# Patient Record
Sex: Female | Born: 1995 | Race: White | Hispanic: No | Marital: Single | Smoking: Never smoker
Health system: Southern US, Community
[De-identification: ages and names within clinical notes are randomized; demographics above are authoritative.]

## PROBLEM LIST (undated history)

## (undated) DIAGNOSIS — T50902A Poisoning by unspecified drugs, medicaments and biological substances, intentional self-harm, initial encounter: Secondary | ICD-10-CM

---
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
## 2013-06-13 ENCOUNTER — Ambulatory Visit (INDEPENDENT_AMBULATORY_CARE_PROVIDER_SITE_OTHER): Payer: BC Managed Care – PPO | Admitting: Physician Assistant

## 2013-06-13 VITALS — BP 124/78 | HR 76 | Temp 98.6°F | Resp 17 | Ht 63.0 in | Wt 109.0 lb

## 2013-06-13 DIAGNOSIS — R319 Hematuria, unspecified: Secondary | ICD-10-CM

## 2013-06-13 DIAGNOSIS — N390 Urinary tract infection, site not specified: Secondary | ICD-10-CM

## 2013-06-13 LAB — POCT URINE PREGNANCY: PREG TEST UR: NEGATIVE

## 2013-06-13 LAB — POCT URINALYSIS DIPSTICK
Bilirubin, UA: NEGATIVE
Glucose, UA: NEGATIVE
KETONES UA: NEGATIVE
Nitrite, UA: NEGATIVE
PH UA: 7
PROTEIN UA: NEGATIVE
Spec Grav, UA: 1.015
UROBILINOGEN UA: 0.2

## 2013-06-13 LAB — POCT UA - MICROSCOPIC ONLY
CASTS, UR, LPF, POC: NEGATIVE
Crystals, Ur, HPF, POC: NEGATIVE
MUCUS UA: NEGATIVE
YEAST UA: NEGATIVE

## 2013-06-13 MED ORDER — NITROFURANTOIN MONOHYD MACRO 100 MG PO CAPS: 100.0000 mg | ORAL_CAPSULE | Freq: Two times a day (BID) | ORAL | Status: DC

## 2013-06-13 NOTE — Progress Notes (Signed)
Subjective:    Patient ID: Westly Pamrang Sipp, female    DOB: 12/09/1995, 18 y.o.   MRN: [id number]  HPI   Ms. Laveda Normanran is a pleasant 18 yr old female here with concern for illness.  Reports 4-5 days of hematuria.  Just trace amounts - looks pink.  At first thought maybe her period started, but not heavy enough to be a period.  She also notes burning with urination.  +frequency, only urinates a small amount.  Some lower abd pain.  No NV, FC.  Actually feeling a little bit better today than she was previously.  Has never had this before.  LMP 12/1 or 12/2, periods irregular, maybe one every two months.  She is sexually active.  No contraception.  Occ condom use.  Last intercourse about 1 wk ago.  Denies concern for STI and declines testing today.     Review of Systems  Constitutional: Negative for fever and chills.  Respiratory: Negative.   Cardiovascular: Negative.   Gastrointestinal: Positive for abdominal pain (lower). Negative for nausea and vomiting.  Genitourinary: Positive for dysuria, frequency and hematuria. Negative for vaginal discharge.  Musculoskeletal: Negative.   Skin: Negative.        Objective:   Physical Exam  Vitals reviewed. Constitutional: She is oriented to person, place, and time. She appears well-developed and well-nourished. No distress.  HENT:  Head: Normocephalic and atraumatic.  Eyes: Conjunctivae are normal. No scleral icterus.  Cardiovascular: Normal rate, regular rhythm and normal heart sounds.   Pulmonary/Chest: Effort normal and breath sounds normal. She has no wheezes. She has no rales.  Abdominal: Soft. There is no tenderness. There is no CVA tenderness.  Neurological: She is alert and oriented to person, place, and time.  Skin: Skin is warm and dry.  Psychiatric: She has a normal mood and affect. Her behavior is normal.    Results for orders placed in visit on 06/13/13  POCT URINALYSIS DIPSTICK      Result Value Range   Color, UA yellow     Clarity, UA  clear     Glucose, UA neg     Bilirubin, UA neg     Ketones, UA neg     Spec Grav, UA 1.015     Blood, UA moderate     pH, UA 7.0     Protein, UA neg     Urobilinogen, UA 0.2     Nitrite, UA neg     Leukocytes, UA small (1+)    POCT UA - MICROSCOPIC ONLY      Result Value Range   WBC, Ur, HPF, POC 1-4     RBC, urine, microscopic 3-5     Bacteria, U Microscopic trace     Mucus, UA neg     Epithelial cells, urine per micros 3-7     Crystals, Ur, HPF, POC neg     Casts, Ur, LPF, POC neg     Yeast, UA neg    POCT URINE PREGNANCY      Result Value Range   Preg Test, Ur Negative           Assessment & Plan:  UTI (urinary tract infection) - Plan: POCT urine pregnancy, Urine culture, nitrofurantoin, macrocrystal-monohydrate, (MACROBID) 100 MG capsule  Blood in urine - Plan: POCT urinalysis dipstick, POCT UA - Microscopic Only   Ms. Laveda Normanran is a pleasant 18 yr old female here with UTI.  HCG is negative today.  Will start macrobid and send cx .  Push fluids.  RTC if worsening or not improving.  Will alter therapy if necessary based on cx results  Briefly discussed contraceptive options with pt as she is currently sexually active and using no form of birth control.  I have printed edu materials for her.  Would be happy to discuss further with her in the future.  Loleta Dicker MHS, PA-C [facility] 1/7/20152:13 PM

## 2013-06-13 NOTE — Patient Instructions (Signed)
Take the antibiotic (nitrofurantoin) as directed.  Be sure to finish the full course.  Drink plenty of water.  I will let you know when your culture results are back.  Please let us know if any symptoms are worsening or not improving  Please read the information about different birth control methods and let me know if you would like to start something   Urinary Tract Infection Urinary tract infections (UTIs) can develop anywhere along your urinary tract. Your urinary tract is your body's drainage system for removing wastes and extra water. Your urinary tract includes two kidneys, two ureters, a bladder, and a urethra. Your kidneys are a pair of bean-shaped organs. Each kidney is about the size of your fist. They are located below your ribs, one on each side of your spine. CAUSES Infections are caused by microbes, which are microscopic organisms, including fungi, viruses, and bacteria. These organisms are so small that they can only be seen through a microscope. Bacteria are the microbes that most commonly cause UTIs. SYMPTOMS  Symptoms of UTIs may vary by age and gender of the patient and by the location of the infection. Symptoms in young women typically include a frequent and intense urge to urinate and a painful, burning feeling in the bladder or urethra during urination. Older women and men are more likely to be tired, shaky, and weak and have muscle aches and abdominal pain. A fever may mean the infection is in your kidneys. Other symptoms of a kidney infection include pain in your back or sides below the ribs, nausea, and vomiting. DIAGNOSIS To diagnose a UTI, your caregiver will ask you about your symptoms. Your caregiver also will ask to provide a urine sample. The urine sample will be tested for bacteria and white blood cells. White blood cells are made by your body to help fight infection. TREATMENT  Typically, UTIs can be treated with medication. Because most UTIs are caused by a bacterial  infection, they usually can be treated with the use of antibiotics. The choice of antibiotic and length of treatment depend on your symptoms and the type of bacteria causing your infection. HOME CARE INSTRUCTIONS  If you were prescribed antibiotics, take them exactly as your caregiver instructs you. Finish the medication even if you feel better after you have only taken some of the medication.  Drink enough water and fluids to keep your urine clear or pale yellow.  Avoid caffeine, tea, and carbonated beverages. They tend to irritate your bladder.  Empty your bladder often. Avoid holding urine for long periods of time.  Empty your bladder before and after sexual intercourse.  After a bowel movement, women should cleanse from front to back. Use each tissue only once. SEEK MEDICAL CARE IF:   You have back pain.  You develop a fever.  Your symptoms do not begin to resolve within 3 days. SEEK IMMEDIATE MEDICAL CARE IF:   You have severe back pain or lower abdominal pain.  You develop chills.  You have nausea or vomiting.  You have continued burning or discomfort with urination. MAKE SURE YOU:   Understand these instructions.  Will watch your condition.  Will get help right away if you are not doing well or get worse. Document Released: 03/03/2005 Document Revised: 11/23/2011 Document Reviewed: 07/02/2011 [facility] Patient Information 2014 [place], [place].

## 2013-06-16 LAB — URINE CULTURE

## 2013-08-21 ENCOUNTER — Encounter (HOSPITAL_COMMUNITY): Payer: Self-pay | Admitting: Emergency Medicine

## 2013-08-21 ENCOUNTER — Emergency Department (HOSPITAL_COMMUNITY)
Admission: EM | Admit: 2013-08-21 | Discharge: 2013-08-22 | Disposition: A | Discharge status: Home / Self Care | Payer: BC Managed Care – PPO | Attending: Emergency Medicine | Admitting: Emergency Medicine

## 2013-08-21 DIAGNOSIS — Z3202 Encounter for pregnancy test, result negative: Secondary | ICD-10-CM | POA: Insufficient documentation

## 2013-08-21 DIAGNOSIS — D72829 Elevated white blood cell count, unspecified: Secondary | ICD-10-CM | POA: Insufficient documentation

## 2013-08-21 DIAGNOSIS — R55 Syncope and collapse: Secondary | ICD-10-CM | POA: Insufficient documentation

## 2013-08-21 DIAGNOSIS — R002 Palpitations: Secondary | ICD-10-CM

## 2013-08-21 DIAGNOSIS — R0602 Shortness of breath: Secondary | ICD-10-CM | POA: Insufficient documentation

## 2013-08-21 DIAGNOSIS — R110 Nausea: Secondary | ICD-10-CM | POA: Insufficient documentation

## 2013-08-21 LAB — CBC WITH DIFFERENTIAL/PLATELET
BASOS ABS: 0 10*3/uL (ref 0.0–0.1)
BASOS PCT: 0 % (ref 0–1)
Eosinophils Absolute: 0.5 10*3/uL (ref 0.0–0.7)
Eosinophils Relative: 4 % (ref 0–5)
HEMATOCRIT: 38 % (ref 36.0–46.0)
Hemoglobin: 12.7 g/dL (ref 12.0–15.0)
LYMPHS PCT: 21 % (ref 12–46)
Lymphs Abs: 2.5 10*3/uL (ref 0.7–4.0)
MCH: 30.5 pg (ref 26.0–34.0)
MCHC: 33.4 g/dL (ref 30.0–36.0)
MCV: 91.1 fL (ref 78.0–100.0)
MONO ABS: 0.8 10*3/uL (ref 0.1–1.0)
Monocytes Relative: 7 % (ref 3–12)
NEUTROS ABS: 7.8 10*3/uL — AB (ref 1.7–7.7)
Neutrophils Relative %: 67 % (ref 43–77)
PLATELETS: 269 10*3/uL (ref 150–400)
RBC: 4.17 MIL/uL (ref 3.87–5.11)
RDW: 13.2 % (ref 11.5–15.5)
WBC: 11.6 10*3/uL — AB (ref 4.0–10.5)

## 2013-08-21 LAB — BASIC METABOLIC PANEL
BUN: 11 mg/dL (ref 6–23)
CO2: 25 meq/L (ref 19–32)
CREATININE: 0.7 mg/dL (ref 0.50–1.10)
Calcium: 9.6 mg/dL (ref 8.4–10.5)
Chloride: 98 mEq/L (ref 96–112)
GFR calc non Af Amer: >90 mL/min (ref >90)
Glucose, Bld: 92 mg/dL (ref 70–99)
POTASSIUM: 4.2 meq/L (ref 3.7–5.3)
SODIUM: 137 meq/L (ref 137–147)

## 2013-08-21 LAB — URINALYSIS, ROUTINE W REFLEX MICROSCOPIC
BILIRUBIN URINE: NEGATIVE
Glucose, UA: NEGATIVE mg/dL
Hgb urine dipstick: NEGATIVE
Ketones, ur: NEGATIVE mg/dL
Nitrite: NEGATIVE
Protein, ur: NEGATIVE mg/dL
Specific Gravity, Urine: 1.017 (ref 1.005–1.030)
UROBILINOGEN UA: 1 mg/dL (ref 0.0–1.0)
pH: 7.5 (ref 5.0–8.0)

## 2013-08-21 LAB — CBG MONITORING, ED: Glucose-Capillary: 73 mg/dL (ref 70–99)

## 2013-08-21 LAB — URINE MICROSCOPIC-ADD ON

## 2013-08-21 LAB — I-STAT TROPONIN, ED: TROPONIN I, POC: 0 ng/mL (ref 0.00–0.08)

## 2013-08-21 LAB — POC URINE PREG, ED: PREG TEST UR: NEGATIVE

## 2013-08-21 NOTE — Discharge Instructions (Signed)
Recommend establishing a primary care provider. Recommend further evaluation of your intermittent heart palpitations, with heart monitor, if symptoms should persist. Resource guide provided below for further follow up and establishment of primary care doctor. Return to Emergency department if you develop any worsening symptoms.    Emergency Department Resource Guide 1) Find a Doctor and Pay Out of Pocket Although you won't have to find out who is covered by your insurance plan, it is a good idea to ask around and get recommendations. You will then need to call the office and see if the doctor you have chosen will accept you as a new patient and what types of options they offer for patients who are self-pay. Some doctors offer discounts or will set up payment plans for their patients who do not have insurance, but you will need to ask so you aren't surprised when you get to your appointment.  2) Contact Your Local Health Department Not all health departments have doctors that can see patients for sick visits, but many do, so it is worth a call to see if yours does. If you don't know where your local health department is, you can check in your phone book. The CDC also has a tool to help you locate your state's health department, and many state websites also have listings of all of their local health departments.  3) Find a Walk-in Clinic If your illness is not likely to be very severe or complicated, you may want to try a walk in clinic. These are popping up all over the country in pharmacies, drugstores, and shopping centers. They're usually staffed by nurse practitioners or physician assistants that have been trained to treat common illnesses and complaints. They're usually fairly quick and inexpensive. However, if you have serious medical issues or chronic medical problems, these are probably not your best option.  No Primary Care Doctor: - Call Health Connect at  [phone] - they can help you locate a  primary care doctor that  accepts your insurance, provides certain services, etc. - Physician Referral Service- [phone]  Chronic Pain Problems: Organization         Address  Phone   Notes  [facility]  [phone] Patients need to be referred by their primary care doctor.   Medication Assistance: Organization         Address  Phone   Notes  [facility] Medication [facility] [address] [place], [place] [address] [phone] --Must be a resident of [facility] -- Must have NO insurance coverage whatsoever (no Medicaid/ Medicare, etc.) -- The pt. MUST have a primary care doctor that directs their care regularly and follows them in the community   MedAssist  [phone]   [employer]  [phone]    Agencies that provide inexpensive medical care: Organization         Address  Phone   Notes  [facility]  [phone]   [facility]    [phone]   [facility] [address] [place], [place] [address] [phone]   [facility] [address] [place], [place] [phone]   Planned Parenthood    [phone]   [facility]    [phone]   [facility] and [facility]  [address], [place] Phone:  [phone], Fax:  [phone] Hours of Operation:  9 am - 6 pm,  M-F.  Also accepts Medicaid/Medicare and self-pay.  [facility]  [address], [place] Phone: [phone], Fax: [phone]. Hours of Operation:  8:30 am - 5:30 pm, M-F.  Also accepts Medicaid and self-pay.  [facility] [place] [address], [place] Phone: [phone]   [facility] [address], [place], [place] [phone], Ext. [phone] Mondays & Thursdays: 7-9 AM.  First 15 patients are seen on a first come, first serve basis.    Medicaid-accepting [facility]  Providers:  Organization         Address  Phone   Notes  [facility] [address], [place] [phone] Also accepts self-pay patients.  [facility] [address], [place]  [phone](336) [phone]   [facility] [address], [place] [phone]) [phone]   [facility] [address] [place] [address], [place] [phone]) [phone]   Renaye RakersVeita Bland [address], [place]   [phone] Only accepts [place] Access [place]icaid patients after they have their name applied to their card.   Self-Pay (no insurance) in [facility]:  Organization         Address  Phone   Notes  Sickle Cell Patients, [facility] [address], [place] [phone]) [phone]   [facility] [address], [place] [phone]   [facility]  [address], [place] [phone]) [phone]   [facility]/Dr. Osei-Bonsu  [address] [place] [address], [place] or [phone], [place] [phone] Phone number for both [place] and [place] locations is the same.  [facility] [address], [place] [phone]) [phone]   [facility] [address] [place] [address], [place] or [address] [phone]   [facility] [address], [place] [phone], phone; [phone]) [phone], fax Sees patients 1st and 3rd Saturday of every month.  Must not qualify for public or private insurance (i.e. Medicaid, Medicare, [place] Health Choice, Veterans' Benefits)  Household income should be no more than 200% of the poverty level The clinic cannot treat you if you are pregnant or think you are pregnant  Sexually transmitted diseases are not treated at the clinic.    Dental Care: Organization         Address  Phone  Notes  [facility] [address], [place] [phone]) [phone] Accepts children up to age 20 who are enrolled in [place]icaid or [place] Health Choice; pregnant women with a Medicaid card; and children who have applied for Medicaid or [place] Health Choice, but were declined, whose parents can pay a reduced fee at time of service.  [facility] [place]  [address], [place] [phone]) [phone] Accepts children up to age 20 who are enrolled in [place]icaid or [place] Health Choice; pregnant women with a Medicaid card; and children who have applied for Medicaid or [place] Health Choice, but were declined, whose parents can pay a reduced fee at time of service.  [facility]  [address], [place] [phone]) [phone] Patients are seen by appointment only. Walk-ins are not accepted. [facility] will see patients 20 years of age and older. Monday -  Tuesday (8am-5pm) Most Wednesdays (8:30-5pm) $30 per visit, cash only  [facility]  [address], [facility] [place] [phone] Patients are seen by appointment only. Walk-ins are not accepted. [facility] will see patients 20 years of age and older. One Wednesday Evening (Monthly: Volunteer Based).  $30 per visit, cash only  [facility]  [phone] for adults; Children under age 24, call Graduate Pediatric Dentistry at [phone]. Children aged 414-14, please call [phone] to request a pediatric application.  Dental services are provided in all areas of dental care including fillings, crowns and bridges, complete and partial dentures, implants, gum treatment, root canals, and extractions. Preventive care is also provided. Treatment is provided to both adults and children. Patients are selected via a lottery and there is often a waiting list.   [facility] [address], [place]  [phone]) [phone] [email or web address]   [facility]  [address], [place], [place] [phone], Ext. [phone] Second and Fourth Thursday of each month, opens at 6:30 AM; Clinic ends at 9 AM.  Patients are seen on a first-come first-served basis, and a limited number are seen during each clinic.   [facility]  [address], [place], [place] [phone]) [phone]   Eligibility Requirements You must have lived in [place], [place], or [place] counties for at least the last three months.   You cannot be eligible for state or federal sponsored National Cityhealthcare insurance, including CIGNAVeterans Administration, [place]icaid, or [employer] EntertainmentMedicare.   You generally cannot be eligible for healthcare insurance through your employer.    How to apply: Eligibility screenings are held every Tuesday and Wednesday afternoon from 1:00 pm until 4:00 pm. You do not need an appointment for the interview!  [facility] [address], [place], [place] [phone]   [facility]  [phone]   [facility]  [phone]   [facility]  [phone]    Behavioral Health Resources in the Community: Intensive Outpatient Programs Organization         Address  Phone  Notes  [facility] [address], [place], [place] [phone]   [facility] [address], [place], [place] [phone]   [facility] [address], [place], [place]  [phone]   [facility] [address],  [place], [place] [phone] or [phone]   Substance Abuse Resources Organization         Address  Phone  Notes  [facility]  [phone]   [facility]  [phone]   [facility]  [phone]   [facility]  [phone]   Residential & Outpatient Substance Abuse Program  [phone]   Psychological Services Organization         Address  Phone  Notes  [facility]  [phone]    [facility]  [phone]   [facility] [address], [place] [phone] or [phone]    Mobile Crisis Teams Organization         Address  Phone  Notes  [facility], Mobile Crisis Care Unit  [phone]   [facility]  [address] [place], [place] [phone]   Doristine LocksSharon DeEsch [address] [place] [phone]    Self-Help/Support Groups Organization         Address  Phone  Notes  [facility] [place] - variety of support groups  336- [phone] Call for more information  Narcotics Anonymous (NA), [facility] [address], [place]  2 meetings at this location   Statisticianesidential Treatment Programs Organization         Address  Phone  Notes  [facility] [address],    [place]  [phone]   [facility]  [address], [place] [phone], [place], [place] [phone]   [facility] [address], [place] [phone] Admissions: 8am-3pm M-F  [facility] [address]    [place], [place] [phone]   [facility] [address], [place], [place] [phone]   [facility] [address]  [place], [place] [phone]   [facility] - Intensive Outpatient [address], [place], [place] [phone]   [facility] ([facility] [address]  [place], [place] [phone] or [phone]   [facility] ([facility]) [address] [place], [place] [phone] Accepts Medicaid  [facility] [address]  [place] [phone] Substance Abuse/Addiction Treatment   [facility] Resources Organization         Address  Phone  Notes  [facility]  [phone]   Angie FavaJulie Brannon, PhD [address], [address] [place], [place]   [phone]) [phone] or [phone](336) [phone]    [facility]    [address] [place], [place] [phone](336) [phone]   [facility] [address], [place], [place] [phone]) [phone] Insurance/Medicaid/sponsorship through [facility]  [facility]ith [facility] Families [address]                                    [place], [place] [phone]) [phone] Therapy/tele-psych/case  [facility] [address]   [place], [place] [phone]) [phone]    Dr. Lolly MustacheArfeen  [phone]) [phone]   [facility] [place]  [employer] [facility] 1) [address], [place] 2) [address], [place] 3)  [address], [place] [phone](336) [phone]  [phone](336) [phone]   [facility] Child Abuse Hotline [phone](336) [phone] or [phone]) [phone] (After Hours)

## 2013-08-21 NOTE — ED Provider Notes (Signed)
CSN: [id number]     Arrival date & time 08/21/13  2022 History   First MD Initiated Contact with Patient 08/21/13 2028     Chief Complaint  Patient presents with  . Loss of Consciousness     (Consider location/radiation/quality/duration/timing/severity/associated sxs/prior Treatment) Patient is a 19 y.o. female presenting with syncope.  Loss of Consciousness  18 yo female presents after syncopal episode that occurred today around 7pm. Patient states she was helping interpret for a friend while his wife was giving birth. Patient states when she walked out of the room she states she began to feel nauseated lightheaded, short of breath, palpitations, and her hearing was diminished prior to fainting. Patient friend at bedside in room and witnessed fall. Friend states patient did not hit her head. States he lowered her to the ground. Patient denies hx of similar events in the past. Patient states she did not eat much today.  History reviewed. No pertinent past medical history. History reviewed. No pertinent past surgical history. No family history on file. History  Substance Use Topics  . Smoking status: Never Smoker   . Smokeless tobacco: Never Used  . Alcohol Use: No   OB History   Grav Para Term Preterm Abortions TAB SAB Ect Mult Living                 Review of Systems  Cardiovascular: Positive for syncope.  All other systems reviewed and are negative.      Allergies  Review of patient's allergies indicates no known allergies.  Home Medications  No current outpatient prescriptions on file. BP 119/66  Pulse 90  Temp(Src) 98.4 F (36.9 C) (Oral)  Resp 19  SpO2 100%  LMP 06/29/2013 Physical Exam  Nursing note and vitals reviewed. Constitutional: She is oriented to person, place, and time. She appears well-developed and well-nourished. No distress.  HENT:  Head: Normocephalic and atraumatic.  Right Ear: Tympanic membrane and ear canal normal.  Left Ear: Tympanic  membrane and ear canal normal.  Nose: Nose normal. Right sinus exhibits no maxillary sinus tenderness and no frontal sinus tenderness. Left sinus exhibits no maxillary sinus tenderness and no frontal sinus tenderness.  Mouth/Throat: Uvula is midline, oropharynx is clear and moist and mucous membranes are normal. No oropharyngeal exudate, posterior oropharyngeal edema or posterior oropharyngeal erythema.  Eyes: Conjunctivae and EOM are normal. Pupils are equal, round, and reactive to light. Right eye exhibits no discharge. Left eye exhibits no discharge. No scleral icterus.  Neck: Trachea normal, normal range of motion and phonation normal. Neck supple. No JVD present. Carotid bruit is not present. No rigidity. No tracheal deviation, no edema and no erythema present.  Cardiovascular: Normal rate and regular rhythm.  Exam reveals no gallop and no friction rub.   No murmur heard. Pulmonary/Chest: Effort normal and breath sounds normal. No stridor. No respiratory distress. She has no wheezes. She has no rhonchi. She has no rales.  Abdominal: Soft. Bowel sounds are normal. She exhibits no distension. There is no hepatosplenomegaly. There is no tenderness. There is no rigidity, no rebound, no guarding, no tenderness at McBurney's point and negative Murphy's sign.  Musculoskeletal: Normal range of motion. She exhibits no edema and no tenderness.  Lymphadenopathy:    She has no cervical adenopathy.  Neurological: She is alert and oriented to person, place, and time. She has normal strength. No cranial nerve deficit or sensory deficit. She displays a negative Romberg sign. Coordination and gait normal. GCS eye subscore is 4.  GCS verbal subscore is 5. GCS motor subscore is 6.  Reflex Scores:      Bicep reflexes are 2+ on the right side and 2+ on the left side.      Patellar reflexes are 2+ on the right side and 2+ on the left side. CN II-XII grossly intact. Cerebellar function appears intact with finger to  nose. Normal Knee to to shin. Patient ambulates in hall without assistance. No pronator drift.  Skin: Skin is warm and dry. She is not diaphoretic.  Psychiatric: She has a normal mood and affect. Her behavior is normal.    ED Course  Procedures (including critical care time) Labs Review Labs Reviewed  CBC WITH DIFFERENTIAL - Abnormal; Notable for the following:    WBC 11.6 (*)    Neutro Abs 7.8 (*)    All other components within normal limits  URINALYSIS, ROUTINE W REFLEX MICROSCOPIC - Abnormal; Notable for the following:    APPearance CLOUDY (*)    Leukocytes, UA TRACE (*)    All other components within normal limits  URINE MICROSCOPIC-ADD ON - Abnormal; Notable for the following:    Squamous Epithelial / LPF MANY (*)    All other components within normal limits  BASIC METABOLIC PANEL  POC URINE PREG, ED  CBG MONITORING, ED  I-STAT TROPOININ, ED   Imaging Review No results found.   EKG Interpretation   Date/Time:  Tuesday August 21 2013 21:26:23 EDT Ventricular Rate:  67 PR Interval:  156 QRS Duration: 97 QT Interval:  386 QTC Calculation: 407 R Axis:   88 Text Interpretation:  Sinus rhythm Normal ECG No old tracing to compare  Confirmed by [facility]  MD, Nicholos JohnsKATHLEEN [phone]([id number]) on 08/21/2013 9:42:45 PM      MDM   Final diagnoses:  Syncope  Palpitations   Patient afebrile with normal VS. Patient in NAD EKG shows normal sinus rhythm.  Troponin negative Mild leukocytosis to 11.6.  BMP is WNL Urine preg negative UA shows trace leukocytes, appears contaminated with many epithelial cells. Pt denies any urinary sxs.  Patient has normal neuro exam. Rest of exam completely benign. Patient states she feels well and is ready to go home. Patient appears stable for discharge by my examination. Patient advised to establish PCP. Provided follow up and resource guide. Discussed need for further work up if symptoms continue. Recommend follow up with PCP in 2 days. Patient agrees with  plan. Discharged in good condition.         Rudene AndaJacob Gray Rodger Giangregorio, PA-C 08/22/13 2036

## 2013-08-21 NOTE — ED Notes (Signed)
Pt arrives to the ER via EMS s/p syncopal episode; [facility] and after seeing baby walked into the hallway and had a syncopal episode; pt states that she hadn't had much to eat today nor had she ever seen anything like that before; per EMS orthostatic VS negative; CBG 79; pt denies neck or back pain; pt complain of intermittent dizziness

## 2013-08-21 NOTE — ED Notes (Signed)
[facility], weak, nauseous and sweaty, states then she was told she passed out and hit her head, pt states she does not remember, pt denies pain, pt a/o x 4 at this time, states she feels tired at this time, denies a hx of syncopal episode.

## 2013-08-24 NOTE — ED Provider Notes (Signed)
Medical screening examination/treatment/procedure(s) were performed by non-physician practitioner and as supervising physician I was immediately available for consultation/collaboration.   EKG Interpretation   Date/Time:  Tuesday August 21 2013 21:26:23 EDT Ventricular Rate:  67 PR Interval:  156 QRS Duration: 97 QT Interval:  386 QTC Calculation: 407 R Axis:   88 Text Interpretation:  Sinus rhythm Normal ECG No old tracing to compare  Confirmed by [facility]  MD, Nicholos JohnsKATHLEEN [phone]) on 08/21/2013 9:42:45 PM        Laray AngerKathleen M Shaelyn Decarli, DO 08/24/13 1435

## 2013-08-27 ENCOUNTER — Ambulatory Visit (INDEPENDENT_AMBULATORY_CARE_PROVIDER_SITE_OTHER): Payer: BC Managed Care – PPO | Admitting: Family Medicine

## 2013-08-27 ENCOUNTER — Encounter: Payer: Self-pay | Admitting: Family Medicine

## 2013-08-27 VITALS — BP 120/75 | HR 76 | Temp 98.6°F | Resp 16 | Ht 61.0 in | Wt 104.4 lb

## 2013-08-27 DIAGNOSIS — Z309 Encounter for contraceptive management, unspecified: Secondary | ICD-10-CM

## 2013-08-27 DIAGNOSIS — Z113 Encounter for screening for infections with a predominantly sexual mode of transmission: Secondary | ICD-10-CM

## 2013-08-27 LAB — POCT URINE PREGNANCY: PREG TEST UR: NEGATIVE

## 2013-08-27 MED ORDER — MEDROXYPROGESTERONE ACETATE 150 MG/ML IM SUSP
150.0000 mg | Freq: Once | INTRAMUSCULAR | Status: AC
  Administered 2013-08-27: 150 mg via INTRAMUSCULAR

## 2013-08-27 NOTE — Patient Instructions (Signed)
Please come and see us for a pregnancy test only in about 3 weeks.  If you prefer to do a home test that is ok too, but be sure to take a test. If it is positive please seek care.    Remember to use back- up contraception for the next 2 weeks at least; condoms are always a great idea to help prevent disease!

## 2013-08-27 NOTE — Progress Notes (Addendum)
[facility] [address], [place] [id number] [phone]  Date:  08/27/2013   Name:  Amber Frey   DOB:  12/03/1995   MRN:  [id number]  PCP:  No primary provider on file.    Chief Complaint: BIRTH CONTROL   History of Present Illness:  Amber Frey is a 18 y.o. very pleasant female patient who presents with the following:  She would like to start on contraception.  She has never used contracpetion in the past.   She is most interested in the depo- provera injection She is generally healthy.  No history of DVT or PE. Not a smoker.  She does not get migraine HA.  She would like to go ahead and get her shot today if possible  Her LMP was one month ago.  She expects her menses any day    She had a negative HCG one week at the ER.  She had passed out and went to the ER for evaluation all turned out to be ok She last had sex 10 days ago; she did not use a condom.    There are no active problems to display for this patient.   No past medical history on file.  No past surgical history on file.  History  Substance Use Topics  . Smoking status: Never Smoker   . Smokeless tobacco: Never Used  . Alcohol Use: No    No family history on file.  No Known Allergies  Medication list has been reviewed and updated.  No current outpatient prescriptions on file prior to visit.   No current facility-administered medications on file prior to visit.    Review of Systems:  As per HPI- otherwise negative.   Physical Examination: Filed Vitals:   08/27/13 0941  BP: 94/60  Pulse: 76  Temp: 98.6 F (37 C)  Resp: 16   Filed Vitals:   08/27/13 0941  Height: 5\' 1"  (1.549 m)  Weight: 104 lb 6.4 oz (47.356 kg)   Body mass index is 19.74 kg/(m^2). Ideal Body Weight: Weight in (lb) to have BMI = 25: 132  GEN: WDWN, NAD, Non-toxic, A & O x 3, looks well HEENT: Atraumatic, Normocephalic. Neck supple. No masses, No LAD. Ears and Nose: No external deformity. CV:  RRR, No M/G/R. No JVD. No thrill. No extra heart sounds. PULM: CTA B, no wheezes, crackles, rhonchi. No retractions. No resp. distress. No accessory muscle use. EXTR: No c/c/e NEURO Normal gait.  PSYCH: Normally interactive. Conversant. Not depressed or anxious appearing.  Calm demeanor.   Results for orders placed in visit on 08/27/13  POCT URINE PREGNANCY      Result Value Ref Range   Preg Test, Ur Negative      Assessment and Plan: Contraception management - Plan: POCT urine pregnancy, medroxyPROGESTERone (DEPO-PROVERA) injection 150 mg  Routine screening for STI (sexually transmitted infection) - Plan: GC/Chlamydia Probe Amp  genprobe pending as above.  Advised her that pregnancy is unlikely but we cannot totally rule it out.  If she would like to use OCP as a bridge to depo we can do this, or can give depo-provera today.  She would like to have her shot today, and will repeat an HCG in about 3 weeks.  See patient instructions for more details.   Come back for repeat depo- provera in 3 months   Signed Abbe AmsterdamJessica Copland, MD  3/24: called and let her know genprobe is negative  Results for orders placed in  visit on 08/27/13  GC/CHLAMYDIA PROBE AMP      Result Value Ref Range   CT Probe RNA NEGATIVE     GC Probe RNA NEGATIVE    POCT URINE PREGNANCY      Result Value Ref Range   Preg Test, Ur Negative

## 2013-08-28 LAB — GC/CHLAMYDIA PROBE AMP
CT PROBE, AMP APTIMA: NEGATIVE
GC Probe RNA: NEGATIVE

## 2013-08-28 NOTE — Addendum Note (Signed)
Addended by: Abbe AmsterdamOPLAND, Audree Schrecengost C on: 08/28/2013 12:53 PM   Modules accepted: Orders

## 2013-09-04 ENCOUNTER — Ambulatory Visit (INDEPENDENT_AMBULATORY_CARE_PROVIDER_SITE_OTHER): Payer: BC Managed Care – PPO | Admitting: Family Medicine

## 2013-09-04 VITALS — BP 102/58 | HR 81 | Temp 98.3°F | Resp 16 | Ht 61.75 in | Wt 107.0 lb

## 2013-09-04 DIAGNOSIS — Z3200 Encounter for pregnancy test, result unknown: Secondary | ICD-10-CM

## 2013-09-04 DIAGNOSIS — N912 Amenorrhea, unspecified: Secondary | ICD-10-CM

## 2013-09-04 LAB — POCT URINE PREGNANCY: Preg Test, Ur: POSITIVE

## 2013-09-04 NOTE — Progress Notes (Signed)
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  She had her last menstrual period February 23rd and she thinks she might be pregnant.  Significant negatives:  Polyuria, nausea Significant positives: some breast tenderness  Objective:   Results for orders placed in visit on 08/27/13  GC/CHLAMYDIA PROBE AMP      Result Value Ref Range   CT Probe RNA NEGATIVE     GC Probe RNA NEGATIVE    POCT URINE PREGNANCY      Result Value Ref Range   Preg Test, Ur Negative     Urine pregnancy test:  Positive  Assessment: unwanted pregnancy  Possible pregnancy - Plan: POCT urine pregnancy, Ambulatory referral to Gynecologic Oncology  Amenorrhea - Plan: Ambulatory referral to Gynecologic Oncology  Signed, Elvina SidleKurt Renato Spellman, MD

## 2013-09-04 NOTE — Patient Instructions (Signed)
I have made an appointment with a gynecologist and you should get a call in next day or so.

## 2013-10-15 ENCOUNTER — Ambulatory Visit (INDEPENDENT_AMBULATORY_CARE_PROVIDER_SITE_OTHER): Payer: BC Managed Care – PPO | Admitting: Family Medicine

## 2013-10-15 VITALS — BP 110/70 | HR 78 | Temp 98.9°F | Resp 16 | Ht 61.0 in | Wt 109.0 lb

## 2013-10-15 DIAGNOSIS — Z3009 Encounter for other general counseling and advice on contraception: Secondary | ICD-10-CM

## 2013-10-15 DIAGNOSIS — R319 Hematuria, unspecified: Secondary | ICD-10-CM

## 2013-10-15 LAB — POCT URINALYSIS DIPSTICK
BILIRUBIN UA: NEGATIVE
Blood, UA: NEGATIVE
GLUCOSE UA: NEGATIVE
KETONES UA: NEGATIVE
LEUKOCYTES UA: NEGATIVE
Nitrite, UA: NEGATIVE
Protein, UA: NEGATIVE
Spec Grav, UA: 1.02
Urobilinogen, UA: 0.2
pH, UA: 6

## 2013-10-15 LAB — POCT UA - MICROSCOPIC ONLY
Bacteria, U Microscopic: NEGATIVE
Casts, Ur, LPF, POC: NEGATIVE
Crystals, Ur, HPF, POC: NEGATIVE
YEAST UA: NEGATIVE

## 2013-10-15 NOTE — Progress Notes (Signed)
I have discussed this case with Ms. Gessner, NP and agree.  

## 2013-10-15 NOTE — Progress Notes (Signed)
Subjective:    Patient ID: Amber Frey, female    DOB: 06/26/1995, 18 y.o.   MRN: [id number]  HPI Patient presents today with complaint of hematuria x 3 days. She reports that there is blood in the toilet and with wiping. She had increased urinary frequency 5 days ago for 1 day  with dysuria x 1 day. Her urine became pink 4 days ago and progressed to red 2 days ago. Upon further questioning, she notes that she thought it might be her period.   Patient had first depo-provera shot 08/27/13, she was seen again 09/04/13 and had a positive urine HCG. She has had an elective termination.   She reports that she has been eating more since she received the Depo provera and is interested in trying oral contraceptive pills. She is not interested in starting a family for "many years."  Review of Systems No fever, +fatigue, no back pain, no nausea/vomiting.    Objective:   Physical Exam  Vitals reviewed. Constitutional: She is oriented to person, place, and time. She appears well-developed and well-nourished.  HENT:  Head: Normocephalic and atraumatic.  Eyes: Conjunctivae are normal.  Neck: Normal range of motion. Neck supple.  Cardiovascular: Normal rate, regular rhythm and normal heart sounds.   Pulmonary/Chest: Effort normal and breath sounds normal.  Abdominal: Soft. Bowel sounds are normal. She exhibits no distension and no mass. There is no tenderness. There is no rebound, no guarding and no CVA tenderness.  Musculoskeletal: Normal range of motion.  Neurological: She is alert and oriented to person, place, and time.  Skin: Skin is warm and dry.  Psychiatric: She has a normal mood and affect. Her behavior is normal. Judgment and thought content normal.     Results for orders placed in visit on 10/15/13  POCT UA - MICROSCOPIC ONLY      Result Value Ref Range   WBC, Ur, HPF, POC 1-4     RBC, urine, microscopic 0-1     Bacteria, U Microscopic neg     Mucus, UA trace     Epithelial cells,  urine per micros 0-1     Crystals, Ur, HPF, POC neg     Casts, Ur, LPF, POC neg     Yeast, UA neg    POCT URINALYSIS DIPSTICK      Result Value Ref Range   Color, UA yellow     Clarity, UA clear     Glucose, UA neg     Bilirubin, UA neg     Ketones, UA neg     Spec Grav, UA 1.020     Blood, UA neg     pH, UA 6.0     Protein, UA neg     Urobilinogen, UA 0.2     Nitrite, UA neg     Leukocytes, UA Negative        Assessment & Plan:  1. Hematuria - POCT UA - Microscopic Only - POCT urinalysis dipstick -discussed results with patient who agrees this may is likely her menses and not hematuria  2. General counseling and advice on female contraception -Discussed options for contraception, including continuing depo provera, oral contraception, Nexplanon -Discussed recent hormonal changes and encouraged patient to try one more depo-provera injection and see if her hormone levels become more stabilized post termination. -Patient agreed to try one more Depo provera injection and she is aware of her injection date window.  Emi Belfasteborah B. Gessner, FNP-BC  [facility]  10/15/2013 9:39 AM

## 2013-11-08 ENCOUNTER — Ambulatory Visit (INDEPENDENT_AMBULATORY_CARE_PROVIDER_SITE_OTHER): Payer: BC Managed Care – PPO | Admitting: Family Medicine

## 2013-11-08 VITALS — BP 102/64 | HR 68 | Temp 98.0°F | Resp 16 | Ht 61.5 in | Wt 106.8 lb

## 2013-11-08 DIAGNOSIS — N390 Urinary tract infection, site not specified: Secondary | ICD-10-CM

## 2013-11-08 DIAGNOSIS — R350 Frequency of micturition: Secondary | ICD-10-CM

## 2013-11-08 LAB — POCT UA - MICROSCOPIC ONLY
Bacteria, U Microscopic: NEGATIVE
Casts, Ur, LPF, POC: NEGATIVE
Crystals, Ur, HPF, POC: NEGATIVE
Mucus, UA: NEGATIVE
WBC, UR, HPF, POC: NEGATIVE
Yeast, UA: NEGATIVE

## 2013-11-08 LAB — POCT URINALYSIS DIPSTICK
BILIRUBIN UA: NEGATIVE
GLUCOSE UA: NEGATIVE
Ketones, UA: NEGATIVE
LEUKOCYTES UA: NEGATIVE
NITRITE UA: NEGATIVE
Protein, UA: NEGATIVE
Spec Grav, UA: <=1.005
UROBILINOGEN UA: 0.2
pH, UA: 7

## 2013-11-08 MED ORDER — NITROFURANTOIN MONOHYD MACRO 100 MG PO CAPS: 100.0000 mg | ORAL_CAPSULE | Freq: Two times a day (BID) | ORAL | Status: DC

## 2013-11-08 NOTE — Patient Instructions (Signed)
Urinary Tract Infection °Urinary tract infections (UTIs) can develop anywhere along your urinary tract. Your urinary tract is your body's drainage system for removing wastes and extra water. Your urinary tract includes two kidneys, two ureters, a bladder, and a urethra. Your kidneys are a pair of bean-shaped organs. Each kidney is about the size of your fist. They are located below your ribs, one on each side of your spine. °CAUSES °Infections are caused by microbes, which are microscopic organisms, including fungi, viruses, and bacteria. These organisms are so small that they can only be seen through a microscope. Bacteria are the microbes that most commonly cause UTIs. °SYMPTOMS  °Symptoms of UTIs may vary by age and gender of the patient and by the location of the infection. Symptoms in young women typically include a frequent and intense urge to urinate and a painful, burning feeling in the bladder or urethra during urination. Older women and men are more likely to be tired, shaky, and weak and have muscle aches and abdominal pain. A fever may mean the infection is in your kidneys. Other symptoms of a kidney infection include pain in your back or sides below the ribs, nausea, and vomiting. °DIAGNOSIS °To diagnose a UTI, your caregiver will ask you about your symptoms. Your caregiver also will ask to provide a urine sample. The urine sample will be tested for bacteria and white blood cells. White blood cells are made by your body to help fight infection. °TREATMENT  °Typically, UTIs can be treated with medication. Because most UTIs are caused by a bacterial infection, they usually can be treated with the use of antibiotics. The choice of antibiotic and length of treatment depend on your symptoms and the type of bacteria causing your infection. °HOME CARE INSTRUCTIONS °· If you were prescribed antibiotics, take them exactly as your caregiver instructs you. Finish the medication even if you feel better after you  have only taken some of the medication. °· Drink enough water and fluids to keep your urine clear or pale yellow. °· Avoid caffeine, tea, and carbonated beverages. They tend to irritate your bladder. °· Empty your bladder often. Avoid holding urine for long periods of time. °· Empty your bladder before and after sexual intercourse. °· After a bowel movement, women should cleanse from front to back. Use each tissue only once. °SEEK MEDICAL CARE IF:  °· You have back pain. °· You develop a fever. °· Your symptoms do not begin to resolve within 3 days. °SEEK IMMEDIATE MEDICAL CARE IF:  °· You have severe back pain or lower abdominal pain. °· You develop chills. °· You have nausea or vomiting. °· You have continued burning or discomfort with urination. °MAKE SURE YOU:  °· Understand these instructions. °· Will watch your condition. °· Will get help right away if you are not doing well or get worse. °Document Released: 03/03/2005 Document Revised: 11/23/2011 Document Reviewed: 07/02/2011 °ExitCare® Patient Information ©2014 [employer]. ° °Hematuria, Adult °Hematuria is blood in your urine. It can be caused by a bladder infection, kidney infection, prostate infection, kidney stone, or cancer of your urinary tract. Infections can usually be treated with medicine, and a kidney stone usually will pass through your urine. If neither of these is the cause of your hematuria, further workup to find out the reason may be needed. °It is very important that you tell your health care provider about any blood you see in your urine, even if the blood stops without treatment or happens without causing   pain. Blood in your urine that happens and then stops and then happens again can be a symptom of a very serious condition. Also, pain is not a symptom in the initial stages of many urinary cancers. °HOME CARE INSTRUCTIONS  °· Drink lots of fluid, 3 4 quarts a day. If you have been diagnosed with an infection, cranberry juice is  especially recommended, in addition to large amounts of water. °· Avoid caffeine, tea, and carbonated beverages, because they tend to irritate the bladder. °· Avoid alcohol because it may irritate the prostate. °· Only take over-the-counter or prescription medicines for pain, discomfort, or fever as directed by your health care provider. °· If you have been diagnosed with a kidney stone, follow your health care provider's instructions regarding straining your urine to catch the stone. °· Empty your bladder often. Avoid holding urine for long periods of time. °· After a bowel movement, women should cleanse front to back. Use each tissue only once. °· Empty your bladder before and after sexual intercourse if you are a female. °SEEK MEDICAL CARE IF: °You develop back pain, fever, a feeling of sickness in your stomach (nausea), or vomiting or if your symptoms are not better in 3 days. Return sooner if you are getting worse. °SEEK IMMEDIATE MEDICAL CARE IF:  °· You have a persistent fever, with a temperature of 101.8°F (38.8°C) or greater. °· You develop severe vomiting and are unable to keep the medicine down. °· You develop severe back or abdominal pain despite taking your medicines. °· You begin passing a large amount of blood or clots in your urine. °· You feel extremely weak or faint, or you pass out. °MAKE SURE YOU:  °· Understand these instructions. °· Will watch your condition. °· Will get help right away if you are not doing well or get worse. °Document Released: 05/24/2005 Document Revised: 03/14/2013 Document Reviewed: 01/22/2013 °ExitCare® Patient Information ©2014 [employer]. ° °

## 2013-11-10 LAB — URINE CULTURE

## 2013-11-15 NOTE — Progress Notes (Signed)
This chart was scribed for Norberto SorensonEva Shaw, MD by Ardelia Memsylan Malpass, Scribe. This patient was seen in room 11 and the patient's care was started at 12:08 PM.  Subjective:    Patient ID: Amber Frey, female    DOB: 05/04/1996, 18 y.o.   MRN: [id number]  Chief Complaint  Patient presents with  . Urinary Frequency    x 1 day   Urinary Frequency  Associated symptoms include frequency and hematuria. Pertinent negatives include no chills, nausea or vomiting.    HPI Comments: Amber Frey is a 18 y.o. female who [facility] complaining of burning dysuria over the past 2 days. She reports associated increased urinary frequency and RLQ abdominal pain over the past 2 days. She states that she has a history of 2 prior UTIs and that her current symptoms are similar. She reports that she had hematuria with prior UTIs. Initial episode of hematuria was on 06/13/13. She was found to have an E. Coli UTI that was pan sensitive and was treated with Macrobid. Her second episode of hematuria occurred on 10/15/13. Her UA was normal, including no RBCs seen on microscopy and negative dip. So patient was advised that this was likely due to onset of menses. She states that she has been drinking cranberry juice and extra fluids without relief of her symptoms. She has not tried any medications for the current symptoms. She states that her LNMP ended 8 days ago. She states that she showers, rather than taking baths. She states that she is not a Counselling psychologistswimmer. She states that she has been eating normally. She states that she has been sexually active with 1 partner in the last year. She started on Depo Provera on 08/27/13. Unfortunately at that time, she had already conceived, and she underwent an elective termination in early April. She states that she last had a negative STD screening about 1 month ago. She denies noticing any changes in the color of her urine. She denies nausea, emesis, back pain, fever, chills, vaginal  discharge or constipation.  No past medical history on file. No current outpatient prescriptions on file prior to visit.   No current facility-administered medications on file prior to visit.   No Known Allergies   Review of Systems  Constitutional: Negative for fever, chills, activity change, appetite change, fatigue and unexpected weight change.  Eyes: Negative for visual disturbance.  Respiratory: Negative for cough and shortness of breath.   Cardiovascular: Negative for chest pain.  Gastrointestinal: Positive for abdominal pain. Negative for nausea, vomiting and constipation.  Genitourinary: Positive for dysuria, frequency and hematuria. Negative for vaginal bleeding.  Musculoskeletal: Negative for back pain and neck pain.  Skin: Negative for rash.  Psychiatric/Behavioral: Negative for confusion.      BP 102/64  Pulse 68  Temp(Src) 98 F (36.7 C) (Oral)  Resp 16  Ht 5' 1.5" (1.562 m)  Wt 106 lb 12.8 oz (48.444 kg)  BMI 19.86 kg/m2  SpO2 99%  LMP 09/27/2013 Objective:   Physical Exam  Nursing note and vitals reviewed. Constitutional: She is oriented to person, place, and time. She appears well-developed and well-nourished. No distress.  HENT:  Head: Normocephalic and atraumatic.  Eyes: Conjunctivae and EOM are normal.  Neck: Normal range of motion. No tracheal deviation present.  Cardiovascular: Normal rate, regular rhythm and normal heart sounds.   No murmur heard. Pulmonary/Chest: Effort normal. No respiratory distress.  Lungs are clear to auscultation  Abdominal: Bowel sounds are normal. There  is tenderness (generalized).  Genitourinary:  No CVA tenderness  Musculoskeletal: Normal range of motion.  Neurological: She is alert and oriented to person, place, and time.  Skin: Skin is warm and dry.  Psychiatric: She has a normal mood and affect. Her behavior is normal.   Results for orders placed in visit on 11/08/13  URINE CULTURE      Result Value Ref Range    Colony Count 30,000 COLONIES/ML     Organism ID, Bacteria Multiple bacterial morphotypes present, none     Organism ID, Bacteria predominant. Suggest appropriate recollection if      Organism ID, Bacteria clinically indicated.    POCT URINALYSIS DIPSTICK      Result Value Ref Range   Color, UA clear     Clarity, UA clear     Glucose, UA neg     Bilirubin, UA neg     Ketones, UA neg     Spec Grav, UA <=1.005     Blood, UA mod     pH, UA 7.0     Protein, UA neg     Urobilinogen, UA 0.2     Nitrite, UA neg     Leukocytes, UA Negative    POCT UA - MICROSCOPIC ONLY      Result Value Ref Range   WBC, Ur, HPF, POC neg     RBC, urine, microscopic 0-1     Bacteria, U Microscopic neg     Mucus, UA neg     Epithelial cells, urine per micros 0-1     Crystals, Ur, HPF, POC neg     Casts, Ur, LPF, POC neg     Yeast, UA neg         Assessment & Plan:   Frequent urination - Plan: POCT urinalysis dipstick, POCT UA - Microscopic Only  UTI (urinary tract infection) - Plan: Urine culture - will go ahead and cover w/ abx while urine culture P. UA looks nml other than hematuria but urine very dilute and sxs just started today.  If UClx is negative - may need further w/u to ensure no kidney stone or interstitial cystitis or other - consider urine cytology, kidney imaging, and/or referral to urology if clx is neg.  Meds ordered this encounter  Medications  . nitrofurantoin, macrocrystal-monohydrate, (MACROBID) 100 MG capsule    Sig: Take 1 capsule (100 mg total) by mouth 2 (two) times daily.    Dispense:  14 capsule    Refill:  0    I personally performed the services described in this documentation, which was scribed in my presence. The recorded information has been reviewed and considered, and addended by me as needed.  Norberto Sorenson, MD MPH

## 2013-11-26 ENCOUNTER — Ambulatory Visit (INDEPENDENT_AMBULATORY_CARE_PROVIDER_SITE_OTHER): Payer: BC Managed Care – PPO | Admitting: Family Medicine

## 2013-11-26 DIAGNOSIS — Z3042 Encounter for surveillance of injectable contraceptive: Secondary | ICD-10-CM

## 2013-11-26 DIAGNOSIS — Z3049 Encounter for surveillance of other contraceptives: Secondary | ICD-10-CM

## 2013-11-26 MED ORDER — MEDROXYPROGESTERONE ACETATE 150 MG/ML IM SUSP
150.0000 mg | Freq: Once | INTRAMUSCULAR | Status: AC
  Administered 2013-11-26: 150 mg via INTRAMUSCULAR

## 2013-11-26 NOTE — Progress Notes (Signed)
   Subjective:    Patient ID: Amber Frey, female    DOB: 06/11/1995, 18 y.o.   MRN: [id number]  HPI  PT Here for depo provera injection for contraception management. Her last depo prover injection was 08/27/2013. Her window is 11/12/2013 - 11/26/2013. She is within her window for injection.   Review of Systems     Objective:   Physical Exam        Assessment & Plan:   Contraception management - depo provera injection Window for next injection: 02/12/2014 - 02/26/2014.

## 2014-01-19 ENCOUNTER — Encounter (HOSPITAL_COMMUNITY): Payer: Self-pay | Admitting: Emergency Medicine

## 2014-01-19 ENCOUNTER — Emergency Department (HOSPITAL_COMMUNITY)
Admission: EM | Admit: 2014-01-19 | Discharge: 2014-01-20 | Disposition: A | Discharge status: Home / Self Care | Payer: BC Managed Care – PPO | Attending: Emergency Medicine | Admitting: Emergency Medicine

## 2014-01-19 DIAGNOSIS — R109 Unspecified abdominal pain: Secondary | ICD-10-CM | POA: Diagnosis present

## 2014-01-19 DIAGNOSIS — Z3202 Encounter for pregnancy test, result negative: Secondary | ICD-10-CM | POA: Insufficient documentation

## 2014-01-19 DIAGNOSIS — R319 Hematuria, unspecified: Secondary | ICD-10-CM | POA: Insufficient documentation

## 2014-01-19 DIAGNOSIS — R300 Dysuria: Secondary | ICD-10-CM

## 2014-01-19 LAB — URINE MICROSCOPIC-ADD ON

## 2014-01-19 LAB — URINALYSIS, ROUTINE W REFLEX MICROSCOPIC
Bilirubin Urine: NEGATIVE
GLUCOSE, UA: NEGATIVE mg/dL
KETONES UR: NEGATIVE mg/dL
Leukocytes, UA: NEGATIVE
NITRITE: NEGATIVE
Protein, ur: NEGATIVE mg/dL
Specific Gravity, Urine: 1.012 (ref 1.005–1.030)
Urobilinogen, UA: 0.2 mg/dL (ref 0.0–1.0)
pH: 6.5 (ref 5.0–8.0)

## 2014-01-19 LAB — POC URINE PREG, ED: Preg Test, Ur: NEGATIVE

## 2014-01-19 NOTE — ED Notes (Signed)
Pt presents with c/o lower abdominal pain that started around 3 am this morning. Pt denies any N/V/D. Pt says she feels the pain only when she is urinating and when she is laying down. Pt says she has seen some blood in her urine.

## 2014-01-19 NOTE — ED Provider Notes (Signed)
CSN: [id number]     Arrival date & time 01/19/14  1922 History   First MD Initiated Contact with Patient 01/19/14 2054     Chief Complaint  Patient presents with  . Abdominal Pain     (Consider location/radiation/quality/duration/timing/severity/associated sxs/prior Treatment) HPI Comments: Patient presents with complaint of lower abdominal pain, dysuria, hematuria and increased frequency which started yesterday. Patient has not had fever, nausea, vomiting or back pain. This is the patient's third such time having these symptoms over the past 6 months. She has been treated with antibiotics prior. Her last menstrual period was one week ago and was normal for her. No previous history of urinary tract infection. Onset of symptoms acute. Course is constant. Nothing makes symptoms better worse.  Patient is a 18 y.o. female presenting with abdominal pain. The history is provided by the patient.  Abdominal Pain Associated symptoms: dysuria and hematuria   Associated symptoms: no chest pain, no cough, no diarrhea, no fever, no nausea, no sore throat, no vaginal bleeding, no vaginal discharge and no vomiting     History reviewed. No pertinent past medical history. History reviewed. No pertinent past surgical history. No family history on file. History  Substance Use Topics  . Smoking status: Never Smoker   . Smokeless tobacco: Never Used  . Alcohol Use: No   OB History   Grav Para Term Preterm Abortions TAB SAB Ect Mult Living                 Review of Systems  Constitutional: Negative for fever.  HENT: Negative for rhinorrhea and sore throat.   Eyes: Negative for redness.  Respiratory: Negative for cough.   Cardiovascular: Negative for chest pain and leg swelling.  Gastrointestinal: Positive for abdominal pain. Negative for nausea, vomiting and diarrhea.  Genitourinary: Positive for dysuria, urgency, frequency and hematuria. Negative for vaginal bleeding, vaginal discharge and pelvic  pain.  Musculoskeletal: Negative for myalgias.  Skin: Negative for rash.  Neurological: Negative for headaches.   Allergies  Review of patient's allergies indicates no known allergies.  Home Medications   Prior to Admission medications   Medication Sig Start Date End Date Taking? Authorizing Provider  medroxyPROGESTERone (DEPO-PROVERA) 150 MG/ML injection Inject 150 mg into the muscle every 3 (three) months.   Yes Historical Provider, MD   BP 130/73  Pulse 84  Temp(Src) 98.2 F (36.8 C) (Oral)  Resp 20  Wt 104 lb (47.174 kg)  SpO2 100%  LMP 01/12/2014  Physical Exam  Nursing note and vitals reviewed. Constitutional: She appears well-developed and well-nourished.  HENT:  Head: Normocephalic and atraumatic.  Eyes: Conjunctivae are normal. Right eye exhibits no discharge. Left eye exhibits no discharge.  Neck: Normal range of motion. Neck supple.  Cardiovascular: Normal rate, regular rhythm and normal heart sounds.   Pulmonary/Chest: Breath sounds normal. No respiratory distress. She has no wheezes. She has no rales.  Abdominal: Soft. There is tenderness (mild, suprapubic). There is no rebound and no guarding.  Genitourinary: Uterus normal. There is no rash, tenderness or lesion on the right labia. There is no rash, tenderness or lesion on the left labia. Uterus is not enlarged and not tender. Cervix exhibits friability (mild). Cervix exhibits no motion tenderness and no discharge. Right adnexum displays no mass and no tenderness. Left adnexum displays no tenderness. No tenderness around the vagina. No signs of injury around the vagina. Vaginal discharge (thick white) found.  Neurological: She is alert.  Skin: Skin is warm and dry.  Psychiatric: She has a normal mood and affect.    ED Course  Procedures (including critical care time) Labs Review Labs Reviewed  WET PREP, GENITAL - Abnormal; Notable for the following:    Clue Cells Wet Prep HPF POC RARE (*)    WBC, Wet Prep  HPF POC FEW (*)    All other components within normal limits  URINALYSIS, ROUTINE W REFLEX MICROSCOPIC - Abnormal; Notable for the following:    Hgb urine dipstick TRACE (*)    All other components within normal limits  URINE MICROSCOPIC-ADD ON - Abnormal; Notable for the following:    Squamous Epithelial / LPF FEW (*)    All other components within normal limits  GC/CHLAMYDIA PROBE AMP  POC URINE PREG, ED    Imaging Review No results found.   EKG Interpretation None      9:37 PM Patient seen and examined. Work-up initiated.  Vital signs reviewed and are as follows: BP 130/73  Pulse 84  Temp(Src) 98.2 F (36.8 C) (Oral)  Resp 20  Wt 104 lb (47.174 kg)  SpO2 100%  LMP 01/12/2014  12:44 AM Pt d/w Dr. Ethelda Chick. Discussed findings with patient. She would like to proceed with pelvic exam. Exam performed with NT chaperone. Patient informed of follow-up. Will give urology follow-up.   Patient urged to return with worsening symptoms or other concerns. Patient verbalized understanding and agrees with plan.    MDM   Final diagnoses:  Hematuria  Dysuria   Hematuria: no infection, no symptoms of kidney stone.   Dysuria: normal pelvic.   Plan: Azo, urology f/u as needed.     Renne Crigler, PA-C 01/20/14 [phone]

## 2014-01-19 NOTE — ED Notes (Signed)
Pelvic Exam is ready.

## 2014-01-19 NOTE — ED Notes (Signed)
Pt sts LMP Aug 6, sts lower abd pain rates pain 7/10 and sts has blood in urine and it hurts to pee.  Reports feels sense of urgency and then cannot go void but a little bit at a time.

## 2014-01-20 LAB — WET PREP, GENITAL
Trich, Wet Prep: NONE SEEN
YEAST WET PREP: NONE SEEN

## 2014-01-20 MED ORDER — PHENAZOPYRIDINE HCL 200 MG PO TABS: 200.0000 mg | ORAL_TABLET | Freq: Three times a day (TID) | ORAL | Status: DC

## 2014-01-20 NOTE — ED Notes (Signed)
Patient is alert and oriented x3.  She was given DC instructions and follow up visit instructions.  Patient gave verbal understanding. She was DC ambulatory under her own power to home.  V/S stable.  He was not showing any signs of distress on DC 

## 2014-01-20 NOTE — ED Provider Notes (Signed)
Medical screening examination/treatment/procedure(s) were performed by non-physician practitioner and as supervising physician I was immediately available for consultation/collaboration.   EKG Interpretation None       Akio Hudnall, MD 01/20/14 0052 

## 2014-01-20 NOTE — Discharge Instructions (Signed)
Please read and follow all provided instructions.  Your diagnoses today include:  1. Hematuria   2. Dysuria     Tests performed today include:  Urine test - shows small amount of blood, no infection  Wet prep - no definite vaginal infection  Vital signs. See below for your results today.   Medications prescribed:   Pyridium - medication for urinary tract infection symptoms.   This medication will turn your urine orange. This is normal.   Home care instructions:  Follow any educational materials contained in this packet.  Follow-up instructions: Please follow-up with your primary care provider in 3 days if symptoms are not resolved for further evaluation of your symptoms.  Return instructions:   Please return to the Emergency Department if you experience worsening symptoms.   Return with fever, worsening pain, persistent vomiting, worsening pain in your back.   Please return if you have any other emergent concerns.  Additional Information:  Your vital signs today were: BP 129/80   Pulse 98   Temp(Src) 98.2 F (36.8 C) (Oral)   Resp 16   Wt 104 lb (47.174 kg)   SpO2 98%   LMP 01/12/2014 If your blood pressure (BP) was elevated above 135/85 this visit, please have this repeated by your doctor within one month. --------------

## 2014-01-22 LAB — GC/CHLAMYDIA PROBE AMP
CT Probe RNA: NEGATIVE
GC Probe RNA: NEGATIVE

## 2014-02-12 ENCOUNTER — Telehealth: Payer: Self-pay

## 2014-02-12 ENCOUNTER — Ambulatory Visit (INDEPENDENT_AMBULATORY_CARE_PROVIDER_SITE_OTHER): Payer: BC Managed Care – PPO | Admitting: Family Medicine

## 2014-02-12 VITALS — BP 104/68 | HR 66 | Temp 98.4°F | Resp 16 | Ht 61.5 in | Wt 106.0 lb

## 2014-02-12 DIAGNOSIS — Z30011 Encounter for initial prescription of contraceptive pills: Secondary | ICD-10-CM

## 2014-02-12 DIAGNOSIS — Z3009 Encounter for other general counseling and advice on contraception: Secondary | ICD-10-CM

## 2014-02-12 MED ORDER — NORGESTIMATE-ETH ESTRADIOL 0.25-35 MG-MCG PO TABS: 1.0000 | ORAL_TABLET | Freq: Every day | ORAL | Status: DC

## 2014-02-12 NOTE — Patient Instructions (Signed)
Oral Contraception Use  Oral contraceptive pills (OCPs) are medicines taken to prevent pregnancy. OCPs work by preventing the ovaries from releasing eggs. The hormones in OCPs also cause the cervical mucus to thicken, preventing the sperm from entering the uterus. The hormones also cause the uterine lining to become thin, not allowing a fertilized egg to attach to the inside of the uterus. OCPs are highly effective when taken exactly as prescribed. However, OCPs do not prevent sexually transmitted diseases (STDs). Safe sex practices, such as using condoms along with an OCP, can help prevent STDs.  Before taking OCPs, you may have a physical exam and Pap test. Your health care provider may also order blood tests if necessary. Your health care provider will make sure you are a good candidate for oral contraception. Discuss with your health care provider the possible side effects of the OCP you may be prescribed. When starting an OCP, it can take 2 to 3 months for the body to adjust to the changes in hormone levels in your body.   HOW TO TAKE ORAL CONTRACEPTIVE PILLS  Your health care provider may advise you on how to start taking the first cycle of OCPs. Otherwise, you can:   · Start on day 1 of your menstrual period. You will not need any backup contraceptive protection with this start time.    · Start on the first Sunday after your menstrual period or the day you get your prescription. In these cases, you will need to use backup contraceptive protection for the first week.    · Start the pill at any time of your cycle. If you take the pill within 5 days of the start of your period, you are protected against pregnancy right away. In this case, you will not need a backup form of birth control. If you start at any other time of your menstrual cycle, you will need to use another form of birth control for 7 days. If your OCP is the type called a minipill, it will protect you from pregnancy after taking it for 2 days (48  hours).  After you have started taking OCPs:   · If you forget to take 1 pill, take it as soon as you remember. Take the next pill at the regular time.    · If you miss 2 or more pills, call your health care provider because different pills have different instructions for missed doses. Use backup birth control until your next menstrual period starts.    · If you use a 28-day pack that contains inactive pills and you miss 1 of the last 7 pills (pills with no hormones), it will not matter. Throw away the rest of the non-hormone pills and start a new pill pack.    No matter which day you start the OCP, you will always start a new pack on that same day of the week. Have an extra pack of OCPs and a backup contraceptive method available in case you miss some pills or lose your OCP pack.   HOME CARE INSTRUCTIONS   · Do not smoke.    · Always use a condom to protect against STDs. OCPs do not protect against STDs.    · Use a calendar to mark your menstrual period days.    · Read the information and directions that came with your OCP. Talk to your health care provider if you have questions.    SEEK MEDICAL CARE IF:   · You develop nausea and vomiting.    · You have abnormal vaginal discharge   or bleeding.    · You develop a rash.    · You miss your menstrual period.    · You are losing your hair.    · You need treatment for mood swings or depression.    · You get dizzy when taking the OCP.    · You develop acne from taking the OCP.    · You become pregnant.    SEEK IMMEDIATE MEDICAL CARE IF:   · You develop chest pain.    · You develop shortness of breath.    · You have an uncontrolled or severe headache.    · You develop numbness or slurred speech.    · You develop visual problems.    · You develop pain, redness, and swelling in the legs.    Document Released: 05/13/2011 Document Revised: 10/08/2013 Document Reviewed: 11/12/2012  ExitCare® Patient Information ©2015 [employer]. This information is not intended to replace  advice given to you by your health care provider. Make sure you discuss any questions you have with your health care provider.  Oral Contraception Information  Oral contraceptive pills (OCPs) are medicines taken to prevent pregnancy. OCPs work by preventing the ovaries from releasing eggs. The hormones in OCPs also cause the cervical mucus to thicken, preventing the sperm from entering the uterus. The hormones also cause the uterine lining to become thin, not allowing a fertilized egg to attach to the inside of the uterus. OCPs are highly effective when taken exactly as prescribed. However, OCPs do not prevent sexually transmitted diseases (STDs). Safe sex practices, such as using condoms along with the pill, can help prevent STDs.   Before taking the pill, you may have a physical exam and Pap test. Your health care provider may order blood tests. The health care provider will make sure you are a good candidate for oral contraception. Discuss with your health care provider the possible side effects of the OCP you may be prescribed. When starting an OCP, it can take 2 to 3 months for the body to adjust to the changes in hormone levels in your body.   TYPES OF ORAL CONTRACEPTION  · The combination pill--This pill contains estrogen and progestin (synthetic progesterone) hormones. The combination pill comes in 21-day, 28-day, or 91-day packs. Some types of combination pills are meant to be taken continuously (365-day pills). With 21-day packs, you do not take pills for 7 days after the last pill. With 28-day packs, the pill is taken every day. The last 7 pills are without hormones. Certain types of pills have more than 21 hormone-containing pills. With 91-day packs, the first 84 pills contain both hormones, and the last 7 pills contain no hormones or contain estrogen only.  · The minipill--This pill contains the progesterone hormone only. The pill is taken every day continuously. It is very important to take the pill at  the same time each day. The minipill comes in packs of 28 pills. All 28 pills contain the hormone.    ADVANTAGES OF ORAL CONTRACEPTIVE PILLS  · Decreases premenstrual symptoms.    · Treats menstrual period cramps.    · Regulates the menstrual cycle.    · Decreases a heavy menstrual flow.    · May treat acne, depending on the type of pill.    · Treats abnormal uterine bleeding.    · Treats polycystic ovarian syndrome.    · Treats endometriosis.    · Can be used as emergency contraception.    THINGS THAT CAN MAKE ORAL CONTRACEPTIVE PILLS LESS EFFECTIVE  OCPs can be less effective if:   · You forget to take the pill at   the same time every day.    · You have a stomach or intestinal disease that lessens the absorption of the pill.    · You take OCPs with other medicines that make OCPs less effective, such as antibiotics, certain HIV medicines, and some seizure medicines.    · You take expired OCPs.    · You forget to restart the pill on day 7, when using the packs of 21 pills.    RISKS ASSOCIATED WITH ORAL CONTRACEPTIVE PILLS   Oral contraceptive pills can sometimes cause side effects, such as:  · Headache.  · Nausea.  · Breast tenderness.  · Irregular bleeding or spotting.  Combination pills are also associated with a small increased risk of:  · Blood clots.  · Heart attack.  · Stroke.  Document Released: 08/14/2002 Document Revised: 03/14/2013 Document Reviewed: 11/12/2012  ExitCare® Patient Information ©2015 [employer]. This information is not intended to replace advice given to you by your health care provider. Make sure you discuss any questions you have with your health care provider.

## 2014-02-12 NOTE — Telephone Encounter (Signed)
Pt has questions about birthcontrol pills she is wanting to know what day to start them.  She received them today

## 2014-02-12 NOTE — Progress Notes (Signed)
   Subjective:    Patient ID: Amber Frey, female    DOB: February 26, 1996, 18 y.o.   MRN: [id number] Chief Complaint  Patient presents with  . Contraception    Depo shot  . advise    Wants to talk about her shot with a DR    HPI  Has been bleeding every single day.  And 1 week of each month has heavier bleeding with clots. Appointment is with urology today as 2 p.m.    History reviewed. No pertinent past medical history. No current outpatient prescriptions on file prior to visit.   No current facility-administered medications on file prior to visit.   No Known Allergies   Review of Systems  Constitutional: Negative for fever, chills, diaphoresis, activity change, appetite change, fatigue and unexpected weight change.  Gastrointestinal: Negative for abdominal pain, diarrhea, constipation, blood in stool, anal bleeding and rectal pain.  Genitourinary: Positive for vaginal bleeding. Negative for dysuria, urgency, frequency, hematuria, decreased urine volume, vaginal discharge, difficulty urinating, genital sores, vaginal pain, menstrual problem, pelvic pain and dyspareunia.  Musculoskeletal: Negative for gait problem.  Skin: Negative for rash.  Hematological: Negative for adenopathy.  Psychiatric/Behavioral: The patient is not nervous/anxious.        Objective:  BP 104/68  Pulse 66  Temp(Src) 98.4 F (36.9 C) (Oral)  Resp 16  Ht 5' 1.5" (1.562 m)  Wt 106 lb (48.081 kg)  BMI 19.71 kg/m2  SpO2 100%  LMP 01/12/2014  Physical Exam  Constitutional: She is oriented to person, place, and time. She appears well-developed and well-nourished. No distress.  HENT:  Head: Normocephalic and atraumatic.  Cardiovascular: Normal rate, regular rhythm, normal heart sounds and intact distal pulses.   Pulmonary/Chest: Effort normal and breath sounds normal.  Abdominal: Soft. Bowel sounds are normal. She exhibits no distension. There is no hepatosplenomegaly. There is no tenderness. There is no  rebound, no guarding and no CVA tenderness.  Neurological: She is alert and oriented to person, place, and time.  Skin: Skin is warm and dry. She is not diaphoretic.  Psychiatric: She has a normal mood and affect. Her behavior is normal.          Assessment & Plan:   Family planning, BCP (birth control pills) initial prescription Reviewed variety of options for pt.  After her medical abortion, pt was told by provider that only stopping irregular vaginal bleeding after 3rd Depo-Provera inj is expected and pt is due today for 3rd Depo-Provera. However, since she is still having daily spotting, she chooses to do trial of OCPs instead - reviewed R/Bs of all.  Discussed doing back-to-back pill packs w/o placebos to skip periods after sev mos.  If any difficulty w/ compliance, will retry Depo.  Rec f/u in 9 mos after 19st birthday for pap.  Hematuria - suspect prior hematuria due to daily spotting w/ depo-provera she has had for past 6 mos so should resolve as periods regulate on OCPs but ok to keep appt for urology eval today.   Meds ordered this encounter  Medications  . norgestimate-ethinyl estradiol (ORTHO-CYCLEN,SPRINTEC,PREVIFEM) 0.25-35 MG-MCG tablet    Sig: Take 1 tablet by mouth daily.    Dispense:  1 Package    Refill:  10    I personally performed the services described in this documentation, which was scribed in my presence. The recorded information has been reviewed and considered, and addended by me as needed.  Norberto Sorenson, MD MPH

## 2014-02-13 NOTE — Telephone Encounter (Signed)
Left message on machine to call back  

## 2014-02-15 NOTE — Telephone Encounter (Signed)
Phone is not accepting calls. When patient calls back she needs the information below: this was also provided to her in her AVS Start on day 1 of your menstrual period. You will not need any backup contraceptive protection with this start time.  Start on the first Sunday after your menstrual period or the day you get your prescription. In these cases, you will need to use backup contraceptive protection for the first week.  Start the pill at any time of your cycle. If you take the pill within 5 days of the start of your period, you are protected against pregnancy right away. In this case, you will not need a backup form of birth control. If you start at any other time of your menstrual cycle, you will need to use another form of birth control for 7 days. If your OCP is the type called a minipill, it will protect you from pregnancy after taking it for 2 days (48 hours).

## 2015-03-11 ENCOUNTER — Other Ambulatory Visit: Payer: Self-pay | Admitting: Family Medicine

## 2015-04-06 ENCOUNTER — Other Ambulatory Visit: Payer: Self-pay | Admitting: Physician Assistant

## 2015-04-07 ENCOUNTER — Inpatient Hospital Stay (HOSPITAL_COMMUNITY)
Admission: EM | Admit: 2015-04-07 | Discharge: 2015-04-11 | DRG: 917 | Disposition: A | Discharge status: Other Acute Inpatient Hospital | Payer: BLUE CROSS/BLUE SHIELD | Attending: Internal Medicine | Admitting: Internal Medicine

## 2015-04-07 ENCOUNTER — Encounter (HOSPITAL_COMMUNITY): Payer: Self-pay | Admitting: Nurse Practitioner

## 2015-04-07 ENCOUNTER — Emergency Department (HOSPITAL_COMMUNITY): Payer: BLUE CROSS/BLUE SHIELD

## 2015-04-07 DIAGNOSIS — R4182 Altered mental status, unspecified: Secondary | ICD-10-CM | POA: Diagnosis not present

## 2015-04-07 DIAGNOSIS — J9600 Acute respiratory failure, unspecified whether with hypoxia or hypercapnia: Secondary | ICD-10-CM

## 2015-04-07 DIAGNOSIS — T391X2A Poisoning by 4-Aminophenol derivatives, intentional self-harm, initial encounter: Secondary | ICD-10-CM | POA: Diagnosis not present

## 2015-04-07 DIAGNOSIS — E861 Hypovolemia: Secondary | ICD-10-CM | POA: Diagnosis not present

## 2015-04-07 DIAGNOSIS — G9341 Metabolic encephalopathy: Secondary | ICD-10-CM | POA: Diagnosis present

## 2015-04-07 DIAGNOSIS — Z452 Encounter for adjustment and management of vascular access device: Secondary | ICD-10-CM

## 2015-04-07 DIAGNOSIS — E874 Mixed disorder of acid-base balance: Secondary | ICD-10-CM | POA: Diagnosis present

## 2015-04-07 DIAGNOSIS — E872 Acidosis, unspecified: Secondary | ICD-10-CM

## 2015-04-07 DIAGNOSIS — J690 Pneumonitis due to inhalation of food and vomit: Secondary | ICD-10-CM | POA: Diagnosis present

## 2015-04-07 DIAGNOSIS — T1491XA Suicide attempt, initial encounter: Secondary | ICD-10-CM

## 2015-04-07 DIAGNOSIS — K922 Gastrointestinal hemorrhage, unspecified: Secondary | ICD-10-CM

## 2015-04-07 DIAGNOSIS — T44992A Poisoning by other drug primarily affecting the autonomic nervous system, intentional self-harm, initial encounter: Secondary | ICD-10-CM | POA: Diagnosis present

## 2015-04-07 DIAGNOSIS — E876 Hypokalemia: Secondary | ICD-10-CM | POA: Diagnosis present

## 2015-04-07 DIAGNOSIS — T391X1A Poisoning by 4-Aminophenol derivatives, accidental (unintentional), initial encounter: Secondary | ICD-10-CM | POA: Diagnosis present

## 2015-04-07 DIAGNOSIS — N179 Acute kidney failure, unspecified: Secondary | ICD-10-CM | POA: Diagnosis present

## 2015-04-07 DIAGNOSIS — T39092A Poisoning by salicylates, intentional self-harm, initial encounter: Secondary | ICD-10-CM

## 2015-04-07 DIAGNOSIS — T39012A Poisoning by aspirin, intentional self-harm, initial encounter: Secondary | ICD-10-CM | POA: Diagnosis present

## 2015-04-07 DIAGNOSIS — R579 Shock, unspecified: Secondary | ICD-10-CM | POA: Diagnosis not present

## 2015-04-07 DIAGNOSIS — R404 Transient alteration of awareness: Secondary | ICD-10-CM

## 2015-04-07 DIAGNOSIS — J9690 Respiratory failure, unspecified, unspecified whether with hypoxia or hypercapnia: Secondary | ICD-10-CM

## 2015-04-07 DIAGNOSIS — Y92009 Unspecified place in unspecified non-institutional (private) residence as the place of occurrence of the external cause: Secondary | ICD-10-CM

## 2015-04-07 DIAGNOSIS — R52 Pain, unspecified: Secondary | ICD-10-CM

## 2015-04-07 DIAGNOSIS — K920 Hematemesis: Secondary | ICD-10-CM | POA: Diagnosis present

## 2015-04-07 LAB — CBC WITH DIFFERENTIAL/PLATELET
Basophils Absolute: 0 10*3/uL (ref 0.0–0.1)
Basophils Relative: 0 %
Eosinophils Absolute: 0 10*3/uL (ref 0.0–0.7)
Eosinophils Relative: 0 %
HCT: 51.4 % — ABNORMAL HIGH (ref 36.0–46.0)
Hemoglobin: 17.2 g/dL — ABNORMAL HIGH (ref 12.0–15.0)
Lymphocytes Relative: 9 %
Lymphs Abs: 3.6 10*3/uL (ref 0.7–4.0)
MCH: 30.8 pg (ref 26.0–34.0)
MCHC: 33.5 g/dL (ref 30.0–36.0)
MCV: 91.9 fL (ref 78.0–100.0)
Monocytes Absolute: 2.8 10*3/uL — ABNORMAL HIGH (ref 0.1–1.0)
Monocytes Relative: 7 %
Neutro Abs: 34 10*3/uL — ABNORMAL HIGH (ref 1.7–7.7)
Neutrophils Relative %: 84 %
Platelets: 396 10*3/uL (ref 150–400)
RBC: 5.59 MIL/uL — ABNORMAL HIGH (ref 3.87–5.11)
RDW: 13 % (ref 11.5–15.5)
WBC: 40.4 10*3/uL — ABNORMAL HIGH (ref 4.0–10.5)

## 2015-04-07 LAB — URINALYSIS, ROUTINE W REFLEX MICROSCOPIC
Bilirubin Urine: NEGATIVE
Glucose, UA: 500 mg/dL — AB
Ketones, ur: NEGATIVE mg/dL
Leukocytes, UA: NEGATIVE
Nitrite: NEGATIVE
Protein, ur: 30 mg/dL — AB
Specific Gravity, Urine: 1.016 (ref 1.005–1.030)
Urobilinogen, UA: 0.2 mg/dL (ref 0.0–1.0)
pH: 5.5 (ref 5.0–8.0)

## 2015-04-07 LAB — COMPREHENSIVE METABOLIC PANEL
ALT: 30 U/L (ref 14–54)
AST: 60 U/L — ABNORMAL HIGH (ref 15–41)
Albumin: 5.8 g/dL — ABNORMAL HIGH (ref 3.5–5.0)
Alkaline Phosphatase: 87 U/L (ref 38–126)
Anion gap: 23 — ABNORMAL HIGH (ref 5–15)
BUN: 13 mg/dL (ref 6–20)
CO2: 7 mmol/L — ABNORMAL LOW (ref 22–32)
Calcium: 9.2 mg/dL (ref 8.9–10.3)
Chloride: 108 mmol/L (ref 101–111)
Creatinine, Ser: 1.16 mg/dL — ABNORMAL HIGH (ref 0.44–1.00)
GFR calc Af Amer: >60 mL/min (ref >60)
GFR calc non Af Amer: >60 mL/min (ref >60)
Glucose, Bld: 180 mg/dL — ABNORMAL HIGH (ref 65–99)
Potassium: 3.4 mmol/L — ABNORMAL LOW (ref 3.5–5.1)
Sodium: 138 mmol/L (ref 135–145)
Total Bilirubin: 0.8 mg/dL (ref 0.3–1.2)
Total Protein: 9.8 g/dL — ABNORMAL HIGH (ref 6.5–8.1)

## 2015-04-07 LAB — BLOOD GAS, ARTERIAL
Acid-base deficit: 22.1 mmol/L — ABNORMAL HIGH (ref 0.0–2.0)
Bicarbonate: 6.3 mEq/L — ABNORMAL LOW (ref 20.0–24.0)
Drawn by: 235321
FIO2: 0.21
O2 Saturation: 96.4 %
Patient temperature: 98.6
TCO2: 5.9 mmol/L (ref 0–100)
pCO2 arterial: 19.4 mmHg — CL (ref 35.0–45.0)
pH, Arterial: 7.139 — CL (ref 7.350–7.450)
pO2, Arterial: 113 mmHg — ABNORMAL HIGH (ref 80.0–100.0)

## 2015-04-07 LAB — SALICYLATE LEVEL: Salicylate Lvl: 45.5 mg/dL (ref 2.8–30.0)

## 2015-04-07 LAB — PREGNANCY, URINE: Preg Test, Ur: NEGATIVE

## 2015-04-07 LAB — ACETAMINOPHEN LEVEL: Acetaminophen (Tylenol), Serum: 217 ug/mL (ref 10–30)

## 2015-04-07 LAB — MAGNESIUM: Magnesium: 2.3 mg/dL (ref 1.7–2.4)

## 2015-04-07 LAB — RAPID URINE DRUG SCREEN, HOSP PERFORMED
Amphetamines: NOT DETECTED
Barbiturates: NOT DETECTED
Benzodiazepines: NOT DETECTED
Cocaine: NOT DETECTED
Opiates: NOT DETECTED
Tetrahydrocannabinol: NOT DETECTED

## 2015-04-07 LAB — URINE MICROSCOPIC-ADD ON

## 2015-04-07 LAB — I-STAT CG4 LACTIC ACID, ED: Lactic Acid, Venous: 8.7 mmol/L (ref 0.5–2.0)

## 2015-04-07 LAB — PROTIME-INR
INR: 1.13 (ref 0.00–1.49)
Prothrombin Time: 14.7 seconds (ref 11.6–15.2)

## 2015-04-07 LAB — ETHANOL: Alcohol, Ethyl (B): <5 mg/dL (ref <5)

## 2015-04-07 MED ORDER — ROCURONIUM BROMIDE 50 MG/5ML IV SOLN
INTRAVENOUS | Status: AC
  Filled 2015-04-07: qty 2

## 2015-04-07 MED ORDER — SODIUM CHLORIDE 0.9 % IV BOLUS (SEPSIS)
1000.0000 mL | Freq: Once | INTRAVENOUS | Status: AC
Start: 2015-04-07 — End: 2015-04-07
  Administered 2015-04-07: 1000 mL via INTRAVENOUS

## 2015-04-07 MED ORDER — DEXTROSE 50 % IV SOLN
50.0000 mL | Freq: Once | INTRAVENOUS | Status: AC
  Administered 2015-04-07: 50 mL via INTRAVENOUS
  Filled 2015-04-07: qty 50

## 2015-04-07 MED ORDER — SODIUM CHLORIDE 0.9 % IV BOLUS (SEPSIS)
1000.0000 mL | Freq: Once | INTRAVENOUS | Status: AC
  Administered 2015-04-07: 1000 mL via INTRAVENOUS

## 2015-04-07 MED ORDER — ETOMIDATE 2 MG/ML IV SOLN
INTRAVENOUS | Status: AC
  Filled 2015-04-07: qty 20

## 2015-04-07 MED ORDER — POTASSIUM CHLORIDE 10 MEQ/100ML IV SOLN
10.0000 meq | INTRAVENOUS | Status: DC
  Administered 2015-04-07: 10 meq via INTRAVENOUS
  Filled 2015-04-07: qty 100

## 2015-04-07 MED ORDER — LIDOCAINE HCL (CARDIAC) 20 MG/ML IV SOLN
INTRAVENOUS | Status: DC
Start: 2015-04-07 — End: 2015-04-07
  Filled 2015-04-07: qty 5

## 2015-04-07 MED ORDER — SUCCINYLCHOLINE CHLORIDE 20 MG/ML IJ SOLN
INTRAMUSCULAR | Status: AC
  Filled 2015-04-07: qty 1

## 2015-04-07 MED ORDER — SODIUM BICARBONATE 8.4 % IV SOLN
50.0000 meq | Freq: Once | INTRAVENOUS | Status: AC
  Administered 2015-04-07: 50 meq via INTRAVENOUS
  Filled 2015-04-07: qty 50

## 2015-04-07 MED ORDER — SODIUM CHLORIDE 0.9 % IV SOLN
8.0000 mg/h | INTRAVENOUS | Status: AC
  Administered 2015-04-07 – 2015-04-09 (×4): 8 mg/h via INTRAVENOUS
  Filled 2015-04-07 (×7): qty 80

## 2015-04-07 MED ORDER — ACETYLCYSTEINE LOAD VIA INFUSION
150.0000 mg/kg | Freq: Once | INTRAVENOUS | Status: AC
Start: 2015-04-07 — End: 2015-04-08
  Administered 2015-04-07: 7215 mg via INTRAVENOUS
  Filled 2015-04-07: qty 181

## 2015-04-07 MED ORDER — DEXTROSE 5 % IV SOLN
15.0000 mg/kg/h | INTRAVENOUS | Status: DC
  Administered 2015-04-07 – 2015-04-09 (×2): 15 mg/kg/h via INTRAVENOUS
  Filled 2015-04-07 (×3): qty 150

## 2015-04-07 NOTE — ED Notes (Signed)
Pt is having projectile emesis, bright red color, I have saved sample in specimen cup and notified the EDP who immediately comes to bedside. See new orders.

## 2015-04-07 NOTE — ED Notes (Signed)
Before administering, pt had another episode of projectile vomiting with blood in emesis. Terri RN, Laretta AlstromMartin NT, and Wells GuilesJoAnna I. RN provided patient hygiene by changing sheets, incont pads, and gown.

## 2015-04-07 NOTE — ED Notes (Signed)
Pt presents emergently, GCS of 9, only responding to painful stimulus, limited purposeful movement, ABC's intact, emesis on arrival, report of suicide attempt by ingestion of tylenol (uknown amount or mg) suicide note on scene, time of ingestion unknown.

## 2015-04-07 NOTE — ED Notes (Signed)
Bed: RESA Expected date:  Expected time:  Means of arrival:  Comments: EMS unresponsive overdose

## 2015-04-07 NOTE — ED Notes (Signed)
Notified edp and nurse results from Istat lactic acid 

## 2015-04-07 NOTE — Progress Notes (Signed)
Patient listed as not having a pcp or insurance.  EDCM went to speak to patient at bedside, however patient too drowsy and lethargic  [facility].

## 2015-04-07 NOTE — H&P (Signed)
PULMONARY / CRITICAL CARE MEDICINE   Name: Amber Frey MRN: [id number] DOB: 11/12/1995    ADMISSION DATE:  04/07/2015 CONSULTATION DATE:  04/07/15  REFERRING MD :  EDP  CHIEF COMPLAINT:  AMS  INITIAL PRESENTATION:  19 y.o. F [facility] after intentional overdose of tylenol and aspirin as part of suicide attempt.  In ED, she was intubated for airway protection and started on NAC.  PCCM called for admission.    STUDIES:  CXR 10/31 >>> normal.  SIGNIFICANT EVENTS: 10/31 - admitted after intentional overdose of tylenol and aspirin as part of suicide attempt.   HISTORY OF PRESENT ILLNESS:  Pt is encephalopathic; therefore, this HPI is obtained from chart review. Amber Pamrang Kowaleski is a 19 y.o. F with no significant PMH, [facility] after family found her at her home unresponsive.  She was apparently last seen normal between 11am and 3pm (she returned home from school at 11am and seemed to be her normal self, was later seen again roughly around 3pm and was again normal).  Later that night around 8:30pm, she was found unresponsive by her family and there were bottles of Aspirin, Sudafed, and Headache medicine (tylenol) near her.  Pt also had a suicide note with her, requesting forgiveness for her shameful act and asking that family take care of her bills.  In ED, she had GCS of 9 and also had a few episodes of projective vomiting, bright red in color.  Tylenol level was found to be 217 and salicylate level 45.5.  Additional labs remarkeable for K 3.4,  SCr 1.16, AG 23.  She was intubated for airway protection and PCCM was called for admission.  [facility] in the event that she were to require HD.   PAST MEDICAL HISTORY :   has no past medical history on file.  has no past surgical history on file. Prior to Admission medications   Medication Sig Start Date End Date Taking? Authorizing Provider  norgestimate-ethinyl estradiol (SPRINTEC 28) 0.25-35  MG-MCG tablet TAKE 1 TABLET BY MOUTH EVERY DAY  "NO MORE REFILLS WITHOUT OFFICE VISIT" 04/06/15   Porfirio Oarhelle Jeffery, PA-C   No Known Allergies  FAMILY HISTORY:  History reviewed. No pertinent family history.  SOCIAL HISTORY:  reports that she has never smoked. She has never used smokeless tobacco. She reports that she does not drink alcohol or use illicit drugs.  REVIEW OF SYSTEMS:  Unable to obtain as pt is encephalopathic.  SUBJECTIVE:   VITAL SIGNS: Pulse Rate:  [124-126] 124 (10/31 2230) Resp:  [26-47] 28 (10/31 2230) BP: (104-154)/(60-85) 154/79 mmHg (10/31 2230) SpO2:  [99 %] 99 % (10/31 2230) Weight:  [48.1 kg (106 lb 0.7 oz)-49.896 kg (110 lb)] 49.896 kg (110 lb) (10/31 2325) HEMODYNAMICS:   VENTILATOR SETTINGS:   INTAKE / OUTPUT: Intake/Output    None     PHYSICAL EXAMINATION: General: Young asian female, unresponsive. Neuro: GCS9 (E 2 / M 5 / S 2). HEENT: [place]/AT. PERRL, sclerae anicteric. Cardiovascular: Tachy, regular, no M/R/G.  Lungs: Respirations shallow and rapid.  Clear bilaterally. Abdomen: BS x 4, soft, NT/ND.  Musculoskeletal: No gross deformities, no edema.  Skin: Intact, warm, no rashes.  LABS:  CBC  Recent Labs Lab 04/07/15 2201  WBC 40.4*  HGB 17.2*  HCT 51.4*  PLT 396   Coag's  Recent Labs Lab 04/07/15 2201  INR 1.13   BMET  Recent Labs Lab 04/07/15 2201  NA  138  K 3.4*  CL 108  CO2 7*  BUN 13  CREATININE 1.16*  GLUCOSE 180*   Electrolytes  Recent Labs Lab 04/07/15 2201  CALCIUM 9.2  MG 2.3   Sepsis Markers  Recent Labs Lab 04/07/15 2213  LATICACIDVEN 8.70*   ABG  Recent Labs Lab 04/07/15 2220  PHART 7.139*  PCO2ART 19.4*  PO2ART 113*   Liver Enzymes  Recent Labs Lab 04/07/15 2201  AST 60*  ALT 30  ALKPHOS 87  BILITOT 0.8  ALBUMIN 5.8*   Cardiac Enzymes No results for input(s): TROPONINI, PROBNP in the last 168 hours. Glucose No results for input(s): GLUCAP in the last 168  hours.  Imaging Dg Chest Portable 1 View  04/07/2015  CLINICAL DATA:  Vomiting.  Overdose. EXAM: PORTABLE CHEST 1 VIEW COMPARISON:  None. FINDINGS: The heart size and mediastinal contours are within normal limits. Both lungs are clear. The visualized skeletal structures are unremarkable. IMPRESSION: Normal examination. Electronically Signed   By: Beckie Salts M.D.   On: 04/07/2015 21:58    ASSESSMENT / PLAN:  GASTROINTESTINAL A:   Tylenol overdose. ? hematemesis - 2 episodes of "projectile vomiting" in ED.  In addition to her ASA overdose, consider mallory weis tear from nausea with vomiting. GI prophylaxis. Nutrition. P:   NAC per pharmacy, continue x 24 hours. Repeat acetaminophen level in 22 hours, if still > 10 then continue NAC for another 24 hours. Repeat LFT's in 22 hours, if not trending down then continue NAC for another 24 hours. Check salicylate level q3hrs until trending down. Monitor OGT for further signs of bleeding. If continues, may need EGD. Pantoprazole gtt. NPO.  RENAL A:   Hypokalemia - repleted in ED. AG + NAG metabolic acidosis - due to ASA + tylenol overdose + lactate + vomiting. AKI. P:   HCO3 @ 75. Check osmoles to calculate osmole gap. Check ethylene glycol, methanol per poison control. May need dialysis. BMP in AM.  PULMONARY OETT 11/1 >>> A: VDRF due to inability to protect airway following intentional overdose as part of suicide attempt. Respiratory alkalosis with metabolic acidosis - due to ASA ingestion / overdose. P:   Full mechanical support, wean as able. Allow high RR given salicylate overdose. VAP prevention measures. SBT when more stable. CXR in AM.  CARDIOVASCULAR HD cath pending 11/1 >>> A:  Prolonged QTc - in setting hyokalemia. P:  Repeat EKG and assess QTc in AM. Trend troponin / lactate.  HEMATOLOGIC A:   Hemoconcentration. VTE Prophylaxis. P:  Monitor blood counts. SCD's only. CBC in AM.  INFECTIOUS A:    Concern for aspiration. P:   Sputum Cx 11/1 >>> Abx: Zosyn, start date 11/1, day 1/x.  ENDOCRINE A:   No acute issues.   P:   Monitor glucose on BMP, maintain > 100.  NEUROLOGIC A:   Acute metabolic encephalopathy. Intentional overdose as part of suicide attempt. P:   Sedation:  Propofol gtt / Fentanyl PRN (caution with oversedation as need to avoid blunting respiratory drive). RASS goal: 0 to -1. Daily WUA. Suicide precautions. Will need psych consult once extubated.   Family updated: Brother at bedside using phone language interpreter.  Interdisciplinary Family Meeting v Palliative Care Meeting:  Due by: 11/7.  CC time:  45 minutes.   Rutherford Guys, [place] - [facility] [place] Pager: [phone]  or [phone] 04/07/2015, 11:50 PM

## 2015-04-07 NOTE — ED Provider Notes (Signed)
CSN: [id number]     Arrival date & time 04/07/15  2134 History   First MD Initiated Contact with Patient 04/07/15 2137     Chief Complaint  Patient presents with  . Suicide Attempt  . Drug Overdose     (Consider location/radiation/quality/duration/timing/severity/associated sxs/prior Treatment) HPI   19yF with intentional drug overdose. Left note. Reportedly acetaminophen, aspirin and sudafed on scene. Unclear of quantities or possible other meds/substances. Came from home around 11am from school and seemed like normal self. Last seen again normal around 3 pm. Found poorly responsive at 8:30-9:00pm by family. Vomit in hair.  of narcan by EMS without clinical response. Pt unable to provide any history. Family at bedside. Somewhat of a language barrier. Pt apparently did not complain of anything specifically recently. Does not appear to have significant PMHx or chronic meds.   History reviewed. No pertinent past medical history. History reviewed. No pertinent past surgical history. History reviewed. No pertinent family history. Social History  Substance Use Topics  . Smoking status: Never Smoker   . Smokeless tobacco: Never Used  . Alcohol Use: No   OB History    No data available     Review of Systems  Level 5 caveat because of encephalopathy.   Allergies  Review of patient's allergies indicates no known allergies.  Home Medications   Prior to Admission medications   Medication Sig Start Date End Date Taking? Authorizing Provider  norgestimate-ethinyl estradiol (SPRINTEC 28) 0.25-35 MG-MCG tablet TAKE 1 TABLET BY MOUTH EVERY DAY  "NO MORE REFILLS WITHOUT OFFICE VISIT" 04/06/15   Chelle Jeffery, PA-C   SpO2 99% Physical Exam  Constitutional:  Laying in bed with eyes closed. In distress. Dried vomit in hair.   HENT:  Head: Normocephalic and atraumatic.  Eyes: Pupils are equal, round, and reactive to light.  Cardiovascular:  tachcyardic  Pulmonary/Chest:  Tachypnea.  Lungs clear.  Abdominal: Soft. She exhibits no distension.  Musculoskeletal: She exhibits no edema or tenderness.  Neurological: GCS eye subscore is 2. GCS verbal subscore is 2. GCS motor subscore is 5.  Moves all extremities equally.  Skin: Skin is warm and dry.  Nursing note and vitals reviewed.   ED Course  Procedures (including critical care time)  CRITICAL CARE Performed by: Raeford Razor Total critical care time: 60 minutes Critical care time was exclusive of separately billable procedures and treating other patients. Critical care was necessary to treat or prevent imminent or life-threatening deterioration. Critical care was time spent personally by me on the following activities: development of treatment plan with patient and/or surrogate as well as nursing, discussions with consultants, evaluation of patient's response to treatment, examination of patient, obtaining history from patient or surrogate, ordering and performing treatments and interventions, ordering and review of laboratory studies, ordering and review of radiographic studies, pulse oximetry and re-evaluation of patient's condition.     Labs Review Labs Reviewed  URINALYSIS, ROUTINE W REFLEX MICROSCOPIC (NOT AT [facility]) - Abnormal; Notable for the following:    APPearance CLOUDY (*)    Glucose, UA 500 (*)    Hgb urine dipstick TRACE (*)    Protein, ur 30 (*)    All other components within normal limits  COMPREHENSIVE METABOLIC PANEL - Abnormal; Notable for the following:    Potassium 3.4 (*)    CO2 7 (*)    Glucose, Bld 180 (*)    Creatinine, Ser 1.16 (*)    Total Protein 9.8 (*)    Albumin 5.8 (*)  AST 60 (*)    Anion gap 23 (*)    All other components within normal limits  BLOOD GAS, ARTERIAL - Abnormal; Notable for the following:    pH, Arterial 7.139 (*)    pCO2 arterial 19.4 (*)    pO2, Arterial 113 (*)    Bicarbonate 6.3 (*)    Acid-base deficit 22.1 (*)    All other components within normal  limits  CBC WITH DIFFERENTIAL/PLATELET - Abnormal; Notable for the following:    WBC 40.4 (*)    RBC 5.59 (*)    Hemoglobin 17.2 (*)    HCT 51.4 (*)    Neutro Abs 34.0 (*)    Monocytes Absolute 2.8 (*)    All other components within normal limits  SALICYLATE LEVEL - Abnormal; Notable for the following:    Salicylate Lvl 45.5 (*)    All other components within normal limits  ACETAMINOPHEN LEVEL - Abnormal; Notable for the following:    Acetaminophen (Tylenol), Serum 217 (*)    All other components within normal limits  URINE MICROSCOPIC-ADD ON - Abnormal; Notable for the following:    Casts HYALINE CASTS (*)    All other components within normal limits  I-STAT CG4 LACTIC ACID, ED - Abnormal; Notable for the following:    Lactic Acid, Venous 8.70 (*)    All other components within normal limits  URINE RAPID DRUG SCREEN, HOSP PERFORMED  PREGNANCY, URINE  PROTIME-INR  MAGNESIUM  ETHANOL  CBC  BASIC METABOLIC PANEL  BLOOD GAS, ARTERIAL  MAGNESIUM  PHOSPHORUS  TROPONIN I  LACTIC ACID, PLASMA  LACTIC ACID, PLASMA  LACTIC ACID, PLASMA  OSMOLALITY  HEPATIC FUNCTION PANEL  TRIGLYCERIDES  SALICYLATE LEVEL  SALICYLATE LEVEL  SALICYLATE LEVEL  SALICYLATE LEVEL  SALICYLATE LEVEL  OCCULT BLOOD GASTRIC / DUODENUM (SPECIMEN CUP)  HEPATIC FUNCTION PANEL  PROTIME-INR  ACETAMINOPHEN LEVEL  I-STAT CG4 LACTIC ACID, ED    Imaging Review Dg Chest Portable 1 View  04/07/2015  CLINICAL DATA:  Vomiting.  Overdose. EXAM: PORTABLE CHEST 1 VIEW COMPARISON:  None. FINDINGS: The heart size and mediastinal contours are within normal limits. Both lungs are clear. The visualized skeletal structures are unremarkable. IMPRESSION: Normal examination. Electronically Signed   By: Beckie SaltsSteven  Reid M.D.   On: 04/07/2015 21:58   I have personally reviewed and evaluated these images and lab results as part of my medical decision-making.   EKG Interpretation None       EKG:  Rhythm: sinus  tachycardia Rate: 124 PR: 130 ms QRS: 97 ms QTc: 507 ms ST segments: NS ST changes   MDM   Final diagnoses:  Suicide attempt (HCC)  Acetaminophen overdose, intentional self-harm, initial encounter (HCC)  Salicylate overdose, intentional self-harm, initial encounter (HCC)  Metabolic acidosis  UGI bleed    Pt vomiting. Appears bloody. GCS is 9 though and has gag. o2 sats normal on room air. CXR w/o acute abnormality. With salicylate overdose would like to avoid intubation if possible. Will monitor closely.  Platelets/coags currently ok. Bicarb for urine alkalinization. Foley placed. Potassium. Pharmacy notified for acetadote dosing. Continued support care. CCM admit.     Raeford RazorStephen Suezette Lafave, MD 04/08/15 [phone]

## 2015-04-07 NOTE — ED Notes (Signed)
Family is at bedside and is offering more HPI: Pt last seen normal is between 11a.m-3pm after she got home from school, on scene also was Asprin, Sudafed and on unknown brand of headache OTC medication. The suicide note per family was requesting for forgiveness for the shameful act, asking that family take care of her bills.

## 2015-04-08 ENCOUNTER — Inpatient Hospital Stay (HOSPITAL_COMMUNITY): Payer: BLUE CROSS/BLUE SHIELD

## 2015-04-08 ENCOUNTER — Encounter (HOSPITAL_COMMUNITY): Payer: Self-pay

## 2015-04-08 DIAGNOSIS — E861 Hypovolemia: Secondary | ICD-10-CM | POA: Diagnosis not present

## 2015-04-08 DIAGNOSIS — R45851 Suicidal ideations: Secondary | ICD-10-CM | POA: Diagnosis not present

## 2015-04-08 DIAGNOSIS — J690 Pneumonitis due to inhalation of food and vomit: Secondary | ICD-10-CM | POA: Diagnosis present

## 2015-04-08 DIAGNOSIS — Y92009 Unspecified place in unspecified non-institutional (private) residence as the place of occurrence of the external cause: Secondary | ICD-10-CM | POA: Diagnosis not present

## 2015-04-08 DIAGNOSIS — T39012A Poisoning by aspirin, intentional self-harm, initial encounter: Secondary | ICD-10-CM | POA: Diagnosis present

## 2015-04-08 DIAGNOSIS — J9600 Acute respiratory failure, unspecified whether with hypoxia or hypercapnia: Secondary | ICD-10-CM | POA: Insufficient documentation

## 2015-04-08 DIAGNOSIS — G9341 Metabolic encephalopathy: Secondary | ICD-10-CM | POA: Diagnosis present

## 2015-04-08 DIAGNOSIS — K922 Gastrointestinal hemorrhage, unspecified: Secondary | ICD-10-CM | POA: Insufficient documentation

## 2015-04-08 DIAGNOSIS — K920 Hematemesis: Secondary | ICD-10-CM | POA: Diagnosis present

## 2015-04-08 DIAGNOSIS — R4182 Altered mental status, unspecified: Secondary | ICD-10-CM | POA: Diagnosis present

## 2015-04-08 DIAGNOSIS — Z452 Encounter for adjustment and management of vascular access device: Secondary | ICD-10-CM | POA: Insufficient documentation

## 2015-04-08 DIAGNOSIS — T1491 Suicide attempt: Secondary | ICD-10-CM

## 2015-04-08 DIAGNOSIS — F322 Major depressive disorder, single episode, severe without psychotic features: Secondary | ICD-10-CM | POA: Diagnosis not present

## 2015-04-08 DIAGNOSIS — R579 Shock, unspecified: Secondary | ICD-10-CM | POA: Diagnosis not present

## 2015-04-08 DIAGNOSIS — N179 Acute kidney failure, unspecified: Secondary | ICD-10-CM | POA: Diagnosis present

## 2015-04-08 DIAGNOSIS — R404 Transient alteration of awareness: Secondary | ICD-10-CM | POA: Diagnosis not present

## 2015-04-08 DIAGNOSIS — T39091A Poisoning by salicylates, accidental (unintentional), initial encounter: Secondary | ICD-10-CM | POA: Insufficient documentation

## 2015-04-08 DIAGNOSIS — E876 Hypokalemia: Secondary | ICD-10-CM | POA: Insufficient documentation

## 2015-04-08 DIAGNOSIS — T391X2A Poisoning by 4-Aminophenol derivatives, intentional self-harm, initial encounter: Secondary | ICD-10-CM | POA: Diagnosis present

## 2015-04-08 DIAGNOSIS — E872 Acidosis, unspecified: Secondary | ICD-10-CM | POA: Insufficient documentation

## 2015-04-08 DIAGNOSIS — T44992A Poisoning by other drug primarily affecting the autonomic nervous system, intentional self-harm, initial encounter: Secondary | ICD-10-CM | POA: Diagnosis present

## 2015-04-08 DIAGNOSIS — T39092A Poisoning by salicylates, intentional self-harm, initial encounter: Secondary | ICD-10-CM

## 2015-04-08 DIAGNOSIS — T391X1A Poisoning by 4-Aminophenol derivatives, accidental (unintentional), initial encounter: Secondary | ICD-10-CM | POA: Insufficient documentation

## 2015-04-08 DIAGNOSIS — E874 Mixed disorder of acid-base balance: Secondary | ICD-10-CM | POA: Diagnosis present

## 2015-04-08 DIAGNOSIS — T391X1S Poisoning by 4-Aminophenol derivatives, accidental (unintentional), sequela: Secondary | ICD-10-CM | POA: Diagnosis not present

## 2015-04-08 DIAGNOSIS — T1491XA Suicide attempt, initial encounter: Secondary | ICD-10-CM | POA: Insufficient documentation

## 2015-04-08 LAB — CBC
HCT: 48 % — ABNORMAL HIGH (ref 36.0–46.0)
HCT: 38.6 % (ref 36.0–46.0)
HCT: 34.4 % — ABNORMAL LOW (ref 36.0–46.0)
HEMATOCRIT: 37.9 % (ref 36.0–46.0)
HEMOGLOBIN: 15.6 g/dL — AB (ref 12.0–15.0)
HEMOGLOBIN: 13 g/dL (ref 12.0–15.0)
HEMOGLOBIN: 11.7 g/dL — AB (ref 12.0–15.0)
Hemoglobin: 12.9 g/dL (ref 12.0–15.0)
MCH: 30.1 pg (ref 26.0–34.0)
MCH: 29.6 pg (ref 26.0–34.0)
MCH: 29.9 pg (ref 26.0–34.0)
MCH: 30.1 pg (ref 26.0–34.0)
MCHC: 32.5 g/dL (ref 30.0–36.0)
MCHC: 33.4 g/dL (ref 30.0–36.0)
MCHC: 34.3 g/dL (ref 30.0–36.0)
MCHC: 34 g/dL (ref 30.0–36.0)
MCV: 92.5 fL (ref 78.0–100.0)
MCV: 88.5 fL (ref 78.0–100.0)
MCV: 87.1 fL (ref 78.0–100.0)
MCV: 88.4 fL (ref 78.0–100.0)
PLATELETS: 301 10*3/uL (ref 150–400)
PLATELETS: 234 10*3/uL (ref 150–400)
PLATELETS: 149 10*3/uL — AB (ref 150–400)
Platelets: 188 10*3/uL (ref 150–400)
RBC: 5.19 MIL/uL — ABNORMAL HIGH (ref 3.87–5.11)
RBC: 4.36 MIL/uL (ref 3.87–5.11)
RBC: 4.35 MIL/uL (ref 3.87–5.11)
RBC: 3.89 MIL/uL (ref 3.87–5.11)
RDW: 13.4 % (ref 11.5–15.5)
RDW: 13.3 % (ref 11.5–15.5)
RDW: 13.2 % (ref 11.5–15.5)
RDW: 13.4 % (ref 11.5–15.5)
WBC: 29.5 10*3/uL — ABNORMAL HIGH (ref 4.0–10.5)
WBC: 21.7 10*3/uL — AB (ref 4.0–10.5)
WBC: 18 10*3/uL — AB (ref 4.0–10.5)
WBC: 12.6 10*3/uL — AB (ref 4.0–10.5)

## 2015-04-08 LAB — BLOOD GAS, ARTERIAL
ACID-BASE DEFICIT: 16.5 mmol/L — AB (ref 0.0–2.0)
ACID-BASE EXCESS: 0.5 mmol/L (ref 0.0–2.0)
ACID-BASE EXCESS: 0.7 mmol/L (ref 0.0–2.0)
Acid-base deficit: 9.4 mmol/L — ABNORMAL HIGH (ref 0.0–2.0)
BICARBONATE: 24.7 meq/L — AB (ref 20.0–24.0)
Bicarbonate: 10.1 mEq/L — ABNORMAL LOW (ref 20.0–24.0)
Bicarbonate: 13.3 mEq/L — ABNORMAL LOW (ref 20.0–24.0)
Bicarbonate: 24.2 mEq/L — ABNORMAL HIGH (ref 20.0–24.0)
DRAWN BY: 419771
DRAWN BY: 445891
DRAWN BY: 445891
Drawn by: 44589
FIO2: 0.4
FIO2: 0.4
FIO2: 0.3
FIO2: 0.3
LHR: 26 {breaths}/min
LHR: 12 {breaths}/min
MECHVT: 360 mL
MECHVT: 360 mL
MECHVT: 360 mL
O2 SAT: 98.8 %
O2 SAT: 95 %
O2 Saturation: 99.2 %
O2 Saturation: 94.9 %
PATIENT TEMPERATURE: 98.6
PATIENT TEMPERATURE: 98.6
PCO2 ART: 27.5 mmHg — AB (ref 35.0–45.0)
PCO2 ART: 36.8 mmHg (ref 35.0–45.0)
PEEP/CPAP: 5 cmH2O
PEEP/CPAP: 5 cmH2O
PEEP/CPAP: 5 cmH2O
PEEP: 5 cmH2O
PH ART: 7.19 — AB (ref 7.350–7.450)
PH ART: 7.434 (ref 7.350–7.450)
PH ART: 7.416 (ref 7.350–7.450)
PO2 ART: 191 mmHg — AB (ref 80.0–100.0)
PO2 ART: 143 mmHg — AB (ref 80.0–100.0)
PO2 ART: 71.4 mmHg — AB (ref 80.0–100.0)
Patient temperature: 98.6
Patient temperature: 98.6
RATE: 35 resp/min
RATE: 12 resp/min
TCO2: 10.9 mmol/L (ref 0–100)
TCO2: 13.9 mmol/L (ref 0–100)
TCO2: 25.4 mmol/L (ref 0–100)
TCO2: 25.9 mmol/L (ref 0–100)
VT: 360 mL
pCO2 arterial: 17.4 mmHg — CL (ref 35.0–45.0)
pCO2 arterial: 39.1 mmHg (ref 35.0–45.0)
pH, Arterial: 7.497 — ABNORMAL HIGH (ref 7.350–7.450)
pO2, Arterial: 71.1 mmHg — ABNORMAL LOW (ref 80.0–100.0)

## 2015-04-08 LAB — BASIC METABOLIC PANEL
ANION GAP: 19 — AB (ref 5–15)
Anion gap: 11 (ref 5–15)
Anion gap: 14 (ref 5–15)
BUN: 11 mg/dL (ref 6–20)
CALCIUM: 7.2 mg/dL — AB (ref 8.9–10.3)
CALCIUM: 7.3 mg/dL — AB (ref 8.9–10.3)
CHLORIDE: 112 mmol/L — AB (ref 101–111)
CO2: 17 mmol/L — ABNORMAL LOW (ref 22–32)
CO2: 26 mmol/L (ref 22–32)
CO2: 25 mmol/L (ref 22–32)
CREATININE: 0.83 mg/dL (ref 0.44–1.00)
Calcium: 7.6 mg/dL — ABNORMAL LOW (ref 8.9–10.3)
Chloride: 101 mmol/L (ref 101–111)
Chloride: 99 mmol/L — ABNORMAL LOW (ref 101–111)
Creatinine, Ser: 1.38 mg/dL — ABNORMAL HIGH (ref 0.44–1.00)
Creatinine, Ser: 0.53 mg/dL (ref 0.44–1.00)
GFR calc Af Amer: >60 mL/min (ref >60)
GFR calc Af Amer: >60 mL/min (ref >60)
GFR calc Af Amer: >60 mL/min (ref >60)
GFR calc non Af Amer: 54 mL/min — ABNORMAL LOW (ref >60)
GLUCOSE: 90 mg/dL (ref 65–99)
GLUCOSE: 84 mg/dL (ref 65–99)
Glucose, Bld: 143 mg/dL — ABNORMAL HIGH (ref 65–99)
POTASSIUM: 3.3 mmol/L — AB (ref 3.5–5.1)
POTASSIUM: 3.6 mmol/L (ref 3.5–5.1)
Potassium: 3.2 mmol/L — ABNORMAL LOW (ref 3.5–5.1)
SODIUM: 148 mmol/L — AB (ref 135–145)
SODIUM: 138 mmol/L (ref 135–145)
SODIUM: 138 mmol/L (ref 135–145)

## 2015-04-08 LAB — HEPATIC FUNCTION PANEL
ALBUMIN: 4.1 g/dL (ref 3.5–5.0)
ALBUMIN: 2.8 g/dL — AB (ref 3.5–5.0)
ALK PHOS: 60 U/L (ref 38–126)
ALK PHOS: 47 U/L (ref 38–126)
ALT: 32 U/L (ref 14–54)
ALT: 27 U/L (ref 14–54)
ALT: 38 U/L (ref 14–54)
ALT: 43 U/L (ref 14–54)
AST: 58 U/L — AB (ref 15–41)
AST: 50 U/L — AB (ref 15–41)
AST: 65 U/L — ABNORMAL HIGH (ref 15–41)
AST: 73 U/L — AB (ref 15–41)
Albumin: 3.2 g/dL — ABNORMAL LOW (ref 3.5–5.0)
Albumin: 3.1 g/dL — ABNORMAL LOW (ref 3.5–5.0)
Alkaline Phosphatase: 45 U/L (ref 38–126)
Alkaline Phosphatase: 41 U/L (ref 38–126)
BILIRUBIN DIRECT: 0.1 mg/dL (ref 0.1–0.5)
BILIRUBIN DIRECT: 0.1 mg/dL (ref 0.1–0.5)
BILIRUBIN DIRECT: 0.1 mg/dL (ref 0.1–0.5)
BILIRUBIN INDIRECT: 0.6 mg/dL (ref 0.3–0.9)
BILIRUBIN INDIRECT: 1.1 mg/dL — AB (ref 0.3–0.9)
BILIRUBIN TOTAL: 1.2 mg/dL (ref 0.3–1.2)
Bilirubin, Direct: <0.1 mg/dL — ABNORMAL LOW (ref 0.1–0.5)
Indirect Bilirubin: 0.8 mg/dL (ref 0.3–0.9)
TOTAL PROTEIN: 7.4 g/dL (ref 6.5–8.1)
Total Bilirubin: 0.3 mg/dL (ref 0.3–1.2)
Total Bilirubin: 0.7 mg/dL (ref 0.3–1.2)
Total Bilirubin: 0.9 mg/dL (ref 0.3–1.2)
Total Protein: 5.3 g/dL — ABNORMAL LOW (ref 6.5–8.1)
Total Protein: 5.3 g/dL — ABNORMAL LOW (ref 6.5–8.1)
Total Protein: 4.8 g/dL — ABNORMAL LOW (ref 6.5–8.1)

## 2015-04-08 LAB — POCT I-STAT 3, ART BLOOD GAS (G3+)
ACID-BASE EXCESS: 1 mmol/L (ref 0.0–2.0)
Bicarbonate: 24.8 mEq/L — ABNORMAL HIGH (ref 20.0–24.0)
O2 Saturation: 100 %
PH ART: 7.468 — AB (ref 7.350–7.450)
TCO2: 26 mmol/L (ref 0–100)
pCO2 arterial: 34.2 mmHg — ABNORMAL LOW (ref 35.0–45.0)
pO2, Arterial: 164 mmHg — ABNORMAL HIGH (ref 80.0–100.0)

## 2015-04-08 LAB — VOLATILES,BLD-ACETONE,ETHANOL,ISOPROP,METHANOL
Acetone, blood: NEGATIVE % (ref 0.000–0.010)
ETHANOL, BLOOD: NEGATIVE % (ref 0.000–0.010)
ISOPROPANOL, BLOOD: NEGATIVE % (ref 0.000–0.010)
METHANOL, BLOOD: NEGATIVE % (ref 0.000–0.010)

## 2015-04-08 LAB — APTT
APTT: 30 s (ref 24–37)
APTT: 36 s (ref 24–37)
APTT: 40 s — AB (ref 24–37)
aPTT: 48 seconds — ABNORMAL HIGH (ref 24–37)

## 2015-04-08 LAB — SALICYLATE LEVEL
SALICYLATE LVL: 36.1 mg/dL — AB (ref 2.8–30.0)
Salicylate Lvl: 6.9 mg/dL (ref 2.8–30.0)
Salicylate Lvl: <4 mg/dL (ref 2.8–30.0)
Salicylate Lvl: <4 mg/dL (ref 2.8–30.0)

## 2015-04-08 LAB — MAGNESIUM: Magnesium: 1.8 mg/dL (ref 1.7–2.4)

## 2015-04-08 LAB — PHOSPHORUS: PHOSPHORUS: 1.7 mg/dL — AB (ref 2.5–4.6)

## 2015-04-08 LAB — PROTIME-INR
INR: 1.53 — AB (ref 0.00–1.49)
INR: 1.5 — ABNORMAL HIGH (ref 0.00–1.49)
INR: 1.49 (ref 0.00–1.49)
INR: 1.56 — ABNORMAL HIGH (ref 0.00–1.49)
PROTHROMBIN TIME: 18.5 s — AB (ref 11.6–15.2)
PROTHROMBIN TIME: 18.1 s — AB (ref 11.6–15.2)
Prothrombin Time: 18.1 seconds — ABNORMAL HIGH (ref 11.6–15.2)
Prothrombin Time: 18.7 seconds — ABNORMAL HIGH (ref 11.6–15.2)

## 2015-04-08 LAB — FIBRINOGEN
FIBRINOGEN: 217 mg/dL (ref 204–475)
FIBRINOGEN: 176 mg/dL — AB (ref 204–475)
Fibrinogen: 175 mg/dL — ABNORMAL LOW (ref 204–475)
Fibrinogen: 211 mg/dL (ref 204–475)

## 2015-04-08 LAB — GLUCOSE, CAPILLARY
GLUCOSE-CAPILLARY: 129 mg/dL — AB (ref 65–99)
GLUCOSE-CAPILLARY: 95 mg/dL (ref 65–99)
Glucose-Capillary: 128 mg/dL — ABNORMAL HIGH (ref 65–99)
Glucose-Capillary: 76 mg/dL (ref 65–99)

## 2015-04-08 LAB — TYPE AND SCREEN
ABO/RH(D): AB POS
Antibody Screen: NEGATIVE

## 2015-04-08 LAB — LACTIC ACID, PLASMA
Lactic Acid, Venous: 3.9 mmol/L (ref 0.5–2.0)
Lactic Acid, Venous: 1.5 mmol/L (ref 0.5–2.0)

## 2015-04-08 LAB — TROPONIN I: Troponin I: 0.03 ng/mL (ref <0.031)

## 2015-04-08 LAB — ETHYLENE GLYCOL: ETHYLENE GLYCOL LVL: NOT DETECTED mg/dL

## 2015-04-08 LAB — TRIGLYCERIDES: Triglycerides: 49 mg/dL (ref <150)

## 2015-04-08 LAB — OCCULT BLOOD GASTRIC / DUODENUM (SPECIMEN CUP)
Occult Blood, Gastric: POSITIVE — AB
pH, Gastric: 3

## 2015-04-08 LAB — OSMOLALITY: Osmolality: 318 mOsm/kg — ABNORMAL HIGH (ref 275–300)

## 2015-04-08 LAB — CORTISOL: CORTISOL PLASMA: 26.8 ug/dL

## 2015-04-08 LAB — MRSA PCR SCREENING: MRSA BY PCR: NEGATIVE

## 2015-04-08 LAB — AMMONIA: AMMONIA: 34 umol/L (ref 9–35)

## 2015-04-08 LAB — ACETAMINOPHEN LEVEL: ACETAMINOPHEN (TYLENOL), SERUM: 102 ug/mL — AB (ref 10–30)

## 2015-04-08 LAB — IRON: IRON: 63 ug/dL (ref 28–170)

## 2015-04-08 LAB — ABO/RH: ABO/RH(D): AB POS

## 2015-04-08 MED ORDER — SODIUM CHLORIDE 0.9 % IV SOLN: 100.0000 mL | INTRAVENOUS | Status: DC | PRN

## 2015-04-08 MED ORDER — HYDROCORTISONE NA SUCCINATE PF 100 MG IJ SOLR: 50.0000 mg | Freq: Four times a day (QID) | INTRAMUSCULAR | Status: DC

## 2015-04-08 MED ORDER — PROPOFOL 1000 MG/100ML IV EMUL
0.0000 ug/kg/min | INTRAVENOUS | Status: DC
  Administered 2015-04-08: 35 ug/kg/min via INTRAVENOUS
  Administered 2015-04-08: 5 ug/kg/min via INTRAVENOUS
  Filled 2015-04-08: qty 100

## 2015-04-08 MED ORDER — FENTANYL CITRATE (PF) 100 MCG/2ML IJ SOLN
100.0000 ug | INTRAMUSCULAR | Status: DC | PRN
  Administered 2015-04-08 (×2): 100 ug via INTRAVENOUS
  Filled 2015-04-08: qty 2

## 2015-04-08 MED ORDER — CHLORHEXIDINE GLUCONATE 0.12% ORAL RINSE (MEDLINE KIT)
15.0000 mL | Freq: Two times a day (BID) | OROMUCOSAL | Status: DC
  Administered 2015-04-08 – 2015-04-09 (×3): 15 mL via OROMUCOSAL

## 2015-04-08 MED ORDER — SODIUM CHLORIDE 0.9 % IV SOLN
10.0000 mg/kg | Freq: Two times a day (BID) | INTRAVENOUS | Status: DC
  Filled 2015-04-08 (×3): qty 0.5

## 2015-04-08 MED ORDER — ANTISEPTIC ORAL RINSE SOLUTION (CORINZ)
7.0000 mL | Freq: Four times a day (QID) | OROMUCOSAL | Status: DC
  Administered 2015-04-08 – 2015-04-10 (×8): 7 mL via OROMUCOSAL

## 2015-04-08 MED ORDER — PROPOFOL 1000 MG/100ML IV EMUL
5.0000 ug/kg/min | Freq: Once | INTRAVENOUS | Status: AC
  Administered 2015-04-08: 50 ug/kg/min via INTRAVENOUS
  Filled 2015-04-08: qty 100

## 2015-04-08 MED ORDER — SODIUM BICARBONATE 8.4 % IV SOLN
100.0000 meq | Freq: Once | INTRAVENOUS | Status: AC
  Administered 2015-04-08: 100 meq via INTRAVENOUS
  Filled 2015-04-08: qty 50

## 2015-04-08 MED ORDER — ETOMIDATE 2 MG/ML IV SOLN
INTRAVENOUS | Status: AC
  Filled 2015-04-08: qty 10

## 2015-04-08 MED ORDER — FENTANYL CITRATE (PF) 100 MCG/2ML IJ SOLN
50.0000 ug | Freq: Once | INTRAMUSCULAR | Status: AC
  Administered 2015-04-08: 50 ug via INTRAVENOUS
  Filled 2015-04-08: qty 2

## 2015-04-08 MED ORDER — SODIUM BICARBONATE 8.4 % IV SOLN
INTRAVENOUS | Status: AC
  Filled 2015-04-08: qty 50

## 2015-04-08 MED ORDER — HEPARIN SODIUM (PORCINE) 1000 UNIT/ML DIALYSIS
20.0000 [IU]/kg | INTRAMUSCULAR | Status: DC | PRN
  Filled 2015-04-08: qty 1

## 2015-04-08 MED ORDER — NOREPINEPHRINE BITARTRATE 1 MG/ML IV SOLN
0.0000 ug/min | INTRAVENOUS | Status: DC
  Administered 2015-04-08: 8 ug/min via INTRAVENOUS
  Filled 2015-04-08 (×2): qty 16

## 2015-04-08 MED ORDER — SODIUM CHLORIDE 0.9 % IV BOLUS (SEPSIS)
1000.0000 mL | Freq: Once | INTRAVENOUS | Status: AC
  Administered 2015-04-08: 1000 mL via INTRAVENOUS

## 2015-04-08 MED ORDER — ETOMIDATE 2 MG/ML IV SOLN
INTRAVENOUS | Status: AC
  Filled 2015-04-08: qty 20

## 2015-04-08 MED ORDER — MIDAZOLAM HCL 2 MG/2ML IJ SOLN
2.0000 mg | Freq: Once | INTRAMUSCULAR | Status: AC
  Administered 2015-04-08: 2 mg via INTRAVENOUS

## 2015-04-08 MED ORDER — SUCCINYLCHOLINE CHLORIDE 20 MG/ML IJ SOLN
INTRAMUSCULAR | Status: AC
  Administered 2015-04-08: 15 mg
  Filled 2015-04-08: qty 1

## 2015-04-08 MED ORDER — ROCURONIUM BROMIDE 50 MG/5ML IV SOLN
INTRAVENOUS | Status: AC
  Filled 2015-04-08: qty 2

## 2015-04-08 MED ORDER — FENTANYL BOLUS VIA INFUSION
50.0000 ug | INTRAVENOUS | Status: DC | PRN
  Filled 2015-04-08: qty 50

## 2015-04-08 MED ORDER — PIPERACILLIN-TAZOBACTAM 3.375 G IVPB
3.3750 g | Freq: Three times a day (TID) | INTRAVENOUS | Status: DC
  Filled 2015-04-08 (×3): qty 50

## 2015-04-08 MED ORDER — ALTEPLASE 2 MG IJ SOLR: 2.0000 mg | Freq: Once | INTRAMUSCULAR | Status: DC | PRN

## 2015-04-08 MED ORDER — PIPERACILLIN-TAZOBACTAM 3.375 G IVPB
3.3750 g | Freq: Once | INTRAVENOUS | Status: DC
  Filled 2015-04-08 (×2): qty 50

## 2015-04-08 MED ORDER — SODIUM CHLORIDE 0.9 % IV SOLN
250.0000 mL | INTRAVENOUS | Status: DC | PRN
  Administered 2015-04-08: 250 mL via INTRAVENOUS

## 2015-04-08 MED ORDER — MIDAZOLAM HCL 2 MG/2ML IJ SOLN
2.0000 mg | Freq: Once | INTRAMUSCULAR | Status: AC
Start: 2015-04-08 — End: 2015-04-08
  Administered 2015-04-08: 2 mg via INTRAVENOUS

## 2015-04-08 MED ORDER — LIDOCAINE HCL (CARDIAC) 20 MG/ML IV SOLN
INTRAVENOUS | Status: AC
  Filled 2015-04-08: qty 5

## 2015-04-08 MED ORDER — MAGNESIUM SULFATE 2 GM/50ML IV SOLN
2.0000 g | Freq: Once | INTRAVENOUS | Status: AC
  Administered 2015-04-08: 2 g via INTRAVENOUS
  Filled 2015-04-08: qty 50

## 2015-04-08 MED ORDER — MIDAZOLAM HCL 2 MG/2ML IJ SOLN
INTRAMUSCULAR | Status: AC
  Administered 2015-04-08: 2 mg via INTRAVENOUS
  Filled 2015-04-08: qty 2

## 2015-04-08 MED ORDER — FENTANYL CITRATE (PF) 100 MCG/2ML IJ SOLN: 50.0000 ug | Freq: Once | INTRAMUSCULAR | Status: DC

## 2015-04-08 MED ORDER — SODIUM CHLORIDE 0.9 % IV SOLN
15.0000 mg/kg | Freq: Once | INTRAVENOUS | Status: AC
  Administered 2015-04-08: 750 mg via INTRAVENOUS
  Filled 2015-04-08 (×2): qty 0.75

## 2015-04-08 MED ORDER — VASOPRESSIN 20 UNIT/ML IV SOLN
0.0300 [IU]/min | INTRAVENOUS | Status: DC
  Administered 2015-04-08: 0.03 [IU]/min via INTRAVENOUS
  Filled 2015-04-08 (×2): qty 2

## 2015-04-08 MED ORDER — MAGNESIUM SULFATE 50 % IJ SOLN
2.0000 g | Freq: Once | INTRAMUSCULAR | Status: DC
  Filled 2015-04-08: qty 4

## 2015-04-08 MED ORDER — MIDAZOLAM HCL 2 MG/2ML IJ SOLN
INTRAMUSCULAR | Status: AC
Start: 2015-04-08 — End: 2015-04-08
  Administered 2015-04-08: 2 mg via INTRAVENOUS
  Filled 2015-04-08: qty 4

## 2015-04-08 MED ORDER — LIDOCAINE-PRILOCAINE 2.5-2.5 % EX CREA: 1.0000 "application " | TOPICAL_CREAM | CUTANEOUS | Status: DC | PRN

## 2015-04-08 MED ORDER — FENTANYL CITRATE (PF) 100 MCG/2ML IJ SOLN
100.0000 ug | INTRAMUSCULAR | Status: AC | PRN
  Administered 2015-04-08 (×3): 100 ug via INTRAVENOUS
  Filled 2015-04-08 (×4): qty 2

## 2015-04-08 MED ORDER — LIDOCAINE HCL (PF) 1 % IJ SOLN: 5.0000 mL | INTRAMUSCULAR | Status: DC | PRN

## 2015-04-08 MED ORDER — SODIUM CHLORIDE 0.9 % IV SOLN
25.0000 ug/h | INTRAVENOUS | Status: DC
  Administered 2015-04-08: 100 ug/h via INTRAVENOUS
  Administered 2015-04-08 – 2015-04-09 (×2): 250 ug/h via INTRAVENOUS
  Filled 2015-04-08 (×3): qty 50

## 2015-04-08 MED ORDER — HEPARIN SODIUM (PORCINE) 1000 UNIT/ML DIALYSIS
1000.0000 [IU] | INTRAMUSCULAR | Status: DC | PRN
  Filled 2015-04-08: qty 1

## 2015-04-08 MED ORDER — PIPERACILLIN-TAZOBACTAM 3.375 G IVPB
3.3750 g | Freq: Once | INTRAVENOUS | Status: AC
  Administered 2015-04-08: 3.375 g via INTRAVENOUS
  Filled 2015-04-08: qty 50

## 2015-04-08 MED ORDER — STERILE WATER FOR INJECTION IV SOLN
INTRAVENOUS | Status: DC
Start: 2015-04-08 — End: 2015-04-09
  Administered 2015-04-08 – 2015-04-09 (×3): via INTRAVENOUS
  Filled 2015-04-08 (×3): qty 850

## 2015-04-08 MED ORDER — ETOMIDATE 2 MG/ML IV SOLN
15.0000 mg | Freq: Once | INTRAVENOUS | Status: AC
  Administered 2015-04-08: 15 mg via INTRAVENOUS
  Filled 2015-04-08: qty 10

## 2015-04-08 MED ORDER — SODIUM CHLORIDE 0.9 % IV SOLN
1.5000 g | Freq: Three times a day (TID) | INTRAVENOUS | Status: DC
  Administered 2015-04-08 – 2015-04-10 (×7): 1.5 g via INTRAVENOUS
  Filled 2015-04-08 (×9): qty 1.5

## 2015-04-08 MED ORDER — PENTAFLUOROPROP-TETRAFLUOROETH EX AERO: 1.0000 "application " | INHALATION_SPRAY | CUTANEOUS | Status: DC | PRN

## 2015-04-08 MED ORDER — DEXTROSE 5 % IV SOLN
0.2000 [IU]/min | INTRAVENOUS | Status: DC
  Filled 2015-04-08: qty 5

## 2015-04-08 MED ORDER — POTASSIUM PHOSPHATES 15 MMOLE/5ML IV SOLN
20.0000 meq | Freq: Once | INTRAVENOUS | Status: AC
  Administered 2015-04-08: 20 meq via INTRAVENOUS
  Filled 2015-04-08 (×2): qty 4.55

## 2015-04-08 NOTE — Procedures (Signed)
Hemodialysis Catheter Insertion Procedure Note Amber Frey [id number] 06/26/1995  Procedure: Insertion of Hemodialysis Catheter Indications: HD  Procedure Details Consent: Risks of procedure as well as the alternatives and risks of each were explained to the (patient/caregiver).  Consent for procedure obtained. Time Out: Verified patient identification, verified procedure, site/side was marked, verified correct patient position, special equipment/implants available, medications/allergies/relevent history reviewed, required imaging and test results available.  Performed  Maximum sterile technique was used including antiseptics, cap, gloves, gown, hand hygiene, mask and sheet. Skin prep: Chlorhexidine; local anesthetic administered A antimicrobial bonded/coated triple lumen catheter was placed in the right internal jugular vein using the Seldinger technique.  Evaluation Blood flow good Complications: No apparent complications Patient did tolerate procedure well. Chest X-ray ordered to verify placement.  CXR: pending.  Procedure performed under direct ultrasound guidance for real time vessel cannulation.      Amber Frey, [place] - [facility] [place] [facility] Pgr: [phone]  or [phone] 04/08/2015, 1:13 AM

## 2015-04-08 NOTE — Consult Note (Signed)
19 year old female status post intentional overdose as an reported suicide attempt. Acetaminophen was 240 and salicylate level was  97.3.   There was a negative urine drug screen. Patient did have hematemesis in the emergency department with altered mental status and was intubated. She was found unresponsive at 8:30 PM on 10/31. Bottles of aspirin, Sudafed, & Tylenol or found near her. There is an elevated lactic acid to 8.7. PH is 7.19 CCM has requested dialysis support.  Pt is receiving a bicarbonate drip and N acetyl-cysteine. She is hemodynamically stable. CCM placed a Trialysis catheter.  History reviewed. No pertinent past medical history. History reviewed. No pertinent past surgical history. Social History:  reports that she has never smoked. She has never used smokeless tobacco. She reports that she does not drink alcohol or use illicit drugs. Allergies: No Known Allergies History reviewed. No pertinent family history.  Medications:  Prior to Admission:  Prescriptions prior to admission  Medication Sig Dispense Refill Last Dose  . norgestimate-ethinyl estradiol (SPRINTEC 28) 0.25-35 MG-MCG tablet TAKE 1 TABLET BY MOUTH EVERY DAY  "NO MORE REFILLS WITHOUT OFFICE VISIT" 28 tablet 0    Scheduled: . antiseptic oral rinse  7 mL Mouth Rinse QID  . chlorhexidine gluconate  15 mL Mouth Rinse BID  . fomepizole (ANTIZOL) IV  10 mg/kg Intravenous Q12H  . piperacillin-tazobactam (ZOSYN)  IV  3.375 g Intravenous Q8H   ROS: Not obtainable  Blood pressure 105/57, pulse 150, temperature 99.9 F (37.7 C), temperature source Oral, resp. rate 28, height 5' (1.524 m), weight 48.353 kg (106 lb 9.6 oz), SpO2 98 %.  General appearance: agitated Head: Normocephalic, without obvious abnormality, atraumatic Eyes: negative Nose: Normal Extremities: extremities normal, atraumatic, no cyanosis or edema Skin: Skin color, texture, turgor normal. No rashes or lesions Neurologic: moving all  extremities Results for orders placed or performed during the hospital encounter of 04/07/15 (from the past 48 hour(s))  Salicylate level     Status: Abnormal   Collection Time: 04/07/15  9:43 PM  Result Value Ref Range   Salicylate Lvl 53.2 (HH) 2.8 - 30.0 mg/dL    Comment: CRITICAL RESULT CALLED TO, READ BACK BY AND VERIFIED WITH: ADKINS,L/ED @2242  ON 04/07/15 BY KARCZEWSKI,S.   Acetaminophen level     Status: Abnormal   Collection Time: 04/07/15  9:43 PM  Result Value Ref Range   Acetaminophen (Tylenol), Serum 217 (HH) 10 - 30 ug/mL    Comment:        THERAPEUTIC CONCENTRATIONS VARY SIGNIFICANTLY. A RANGE OF 10-30 ug/mL MAY BE AN EFFECTIVE CONCENTRATION FOR MANY PATIENTS. HOWEVER, SOME ARE BEST TREATED AT CONCENTRATIONS OUTSIDE THIS RANGE. ACETAMINOPHEN CONCENTRATIONS >150 ug/mL AT 4 HOURS AFTER INGESTION AND >50 ug/mL AT 12 HOURS AFTER INGESTION ARE OFTEN ASSOCIATED WITH TOXIC REACTIONS. CRITICAL RESULT CALLED TO, READ BACK BY AND VERIFIED WITH: ADKINS,L/ED @2242  ON 04/07/15 BY KARCZEWSKI,S.   Ethanol     Status: None   Collection Time: 04/07/15  9:43 PM  Result Value Ref Range   Alcohol, Ethyl (B) <5 <5 mg/dL    Comment:        LOWEST DETECTABLE LIMIT FOR SERUM ALCOHOL IS 5 mg/dL FOR MEDICAL PURPOSES ONLY   Urinalysis, Routine w reflex microscopic (not at [facility])     Status: Abnormal   Collection Time: 04/07/15  9:52 PM  Result Value Ref Range   Color, Urine YELLOW YELLOW   APPearance CLOUDY (A) CLEAR   Specific Gravity, Urine 1.016 1.005 - 1.030   pH 5.5  5.0 - 8.0   Glucose, UA 500 (A) NEGATIVE mg/dL   Hgb urine dipstick TRACE (A) NEGATIVE   Bilirubin Urine NEGATIVE NEGATIVE   Ketones, ur NEGATIVE NEGATIVE mg/dL   Protein, ur 30 (A) NEGATIVE mg/dL   Urobilinogen, UA 0.2 0.0 - 1.0 mg/dL   Nitrite NEGATIVE NEGATIVE   Leukocytes, UA NEGATIVE NEGATIVE  Pregnancy, urine     Status: None   Collection Time: 04/07/15  9:52 PM  Result Value Ref Range   Preg Test,  Ur NEGATIVE NEGATIVE    Comment:        THE SENSITIVITY OF THIS METHODOLOGY IS >20 mIU/mL.   Urine microscopic-add on     Status: Abnormal   Collection Time: 04/07/15  9:52 PM  Result Value Ref Range   Squamous Epithelial / LPF RARE RARE   WBC, UA 11-20 <3 WBC/hpf   RBC / HPF 0-2 <3 RBC/hpf   Bacteria, UA RARE RARE   Casts HYALINE CASTS (A) NEGATIVE  Urine rapid drug screen (hosp performed)     Status: None   Collection Time: 04/07/15  9:53 PM  Result Value Ref Range   Opiates NONE DETECTED NONE DETECTED   Cocaine NONE DETECTED NONE DETECTED   Benzodiazepines NONE DETECTED NONE DETECTED   Amphetamines NONE DETECTED NONE DETECTED   Tetrahydrocannabinol NONE DETECTED NONE DETECTED   Barbiturates NONE DETECTED NONE DETECTED    Comment:        DRUG SCREEN FOR MEDICAL PURPOSES ONLY.  IF CONFIRMATION IS NEEDED FOR ANY PURPOSE, NOTIFY LAB WITHIN 5 DAYS.        LOWEST DETECTABLE LIMITS FOR URINE DRUG SCREEN Drug Class       Cutoff (ng/mL) Amphetamine      1000 Barbiturate      200 Benzodiazepine   539 Tricyclics       767 Opiates          300 Cocaine          300 THC              50   Comprehensive metabolic panel     Status: Abnormal   Collection Time: 04/07/15 10:01 PM  Result Value Ref Range   Sodium 138 135 - 145 mmol/L    Comment: REPEATED TO VERIFY   Potassium 3.4 (L) 3.5 - 5.1 mmol/L    Comment: REPEATED TO VERIFY   Chloride 108 101 - 111 mmol/L    Comment: REPEATED TO VERIFY   CO2 7 (L) 22 - 32 mmol/L    Comment: REPEATED TO VERIFY   Glucose, Bld 180 (H) 65 - 99 mg/dL   BUN 13 6 - 20 mg/dL   Creatinine, Ser 1.16 (H) 0.44 - 1.00 mg/dL   Calcium 9.2 8.9 - 10.3 mg/dL   Total Protein 9.8 (H) 6.5 - 8.1 g/dL   Albumin 5.8 (H) 3.5 - 5.0 g/dL   AST 60 (H) 15 - 41 U/L   ALT 30 14 - 54 U/L   Alkaline Phosphatase 87 38 - 126 U/L   Total Bilirubin 0.8 0.3 - 1.2 mg/dL   GFR calc non Af Amer >60 >60 mL/min   GFR calc Af Amer >60 >60 mL/min    Comment: (NOTE) The eGFR  has been calculated using the CKD EPI equation. This calculation has not been validated in all clinical situations. eGFR's persistently <60 mL/min signify possible Chronic Kidney Disease.    Anion gap 23 (H) 5 - 15    Comment: RESULT CHECKED  CBC with Differential     Status: Abnormal   Collection Time: 04/07/15 10:01 PM  Result Value Ref Range   WBC 40.4 (H) 4.0 - 10.5 K/uL   RBC 5.59 (H) 3.87 - 5.11 MIL/uL   Hemoglobin 17.2 (H) 12.0 - 15.0 g/dL   HCT 51.4 (H) 36.0 - 46.0 %   MCV 91.9 78.0 - 100.0 fL   MCH 30.8 26.0 - 34.0 pg   MCHC 33.5 30.0 - 36.0 g/dL   RDW 13.0 11.5 - 15.5 %   Platelets 396 150 - 400 K/uL   Neutrophils Relative % 84 %   Lymphocytes Relative 9 %   Monocytes Relative 7 %   Eosinophils Relative 0 %   Basophils Relative 0 %   Neutro Abs 34.0 (H) 1.7 - 7.7 K/uL   Lymphs Abs 3.6 0.7 - 4.0 K/uL   Monocytes Absolute 2.8 (H) 0.1 - 1.0 K/uL   Eosinophils Absolute 0.0 0.0 - 0.7 K/uL   Basophils Absolute 0.0 0.0 - 0.1 K/uL   WBC Morphology WHITE COUNT CONFIRMED ON SMEAR   Protime-INR     Status: None   Collection Time: 04/07/15 10:01 PM  Result Value Ref Range   Prothrombin Time 14.7 11.6 - 15.2 seconds   INR 1.13 0.00 - 1.49  Magnesium     Status: None   Collection Time: 04/07/15 10:01 PM  Result Value Ref Range   Magnesium 2.3 1.7 - 2.4 mg/dL  I-Stat CG4 Lactic Acid, ED     Status: Abnormal   Collection Time: 04/07/15 10:13 PM  Result Value Ref Range   Lactic Acid, Venous 8.70 (HH) 0.5 - 2.0 mmol/L   Comment NOTIFIED PHYSICIAN   Blood gas, arterial ([facility] & [facility] ONLY)     Status: Abnormal   Collection Time: 04/07/15 10:20 PM  Result Value Ref Range   FIO2 0.21    pH, Arterial 7.139 (LL) 7.350 - 7.450    Comment: CRITICAL RESULT CALLED TO, READ BACK BY AND VERIFIED WITH: Virgel Manifold, MD AT 2227 BY TABATHA KNAPP, RRT, RCP ON 04/07/15    pCO2 arterial 19.4 (LL) 35.0 - 45.0 mmHg    Comment: CRITICAL RESULT CALLED TO, READ BACK BY AND VERIFIED WITH: Virgel Manifold, MD AT 2227 BY TABATHA KNAPP, RRT, RCP ON 04/07/15    pO2, Arterial 113 (H) 80.0 - 100.0 mmHg   Bicarbonate 6.3 (L) 20.0 - 24.0 mEq/L   TCO2 5.9 0 - 100 mmol/L   Acid-base deficit 22.1 (H) 0.0 - 2.0 mmol/L   O2 Saturation 96.4 %   Patient temperature 98.6    Collection site BRACHIAL ARTERY    Drawn by [id number]    Sample type ARTERIAL DRAW   Occult bld gastric/duodenum (cup to lab)     Status: Abnormal   Collection Time: 04/08/15 12:43 AM  Result Value Ref Range   pH, Gastric 3    Occult Blood, Gastric POSITIVE (A) NEGATIVE  Glucose, capillary     Status: Abnormal   Collection Time: 04/08/15  3:13 AM  Result Value Ref Range   Glucose-Capillary 128 (H) 65 - 99 mg/dL  MRSA PCR Screening     Status: None   Collection Time: 04/08/15  3:36 AM  Result Value Ref Range   MRSA by PCR NEGATIVE NEGATIVE    Comment:        The GeneXpert MRSA Assay (FDA approved for NASAL specimens only), is one component of a comprehensive MRSA colonization surveillance program. It is not intended to  diagnose MRSA infection nor to guide or monitor treatment for MRSA infections.   Type and screen [place]     Status: None   Collection Time: 04/08/15  4:30 AM  Result Value Ref Range   ABO/RH(D) AB POS    Antibody Screen NEG    Sample Expiration 04/11/2015   Triglycerides     Status: None   Collection Time: 04/08/15  4:48 AM  Result Value Ref Range   Triglycerides 49 <150 mg/dL  Hepatic function panel     Status: Abnormal   Collection Time: 04/08/15  4:48 AM  Result Value Ref Range   Total Protein 7.4 6.5 - 8.1 g/dL   Albumin 4.1 3.5 - 5.0 g/dL   AST 58 (H) 15 - 41 U/L   ALT 32 14 - 54 U/L   Alkaline Phosphatase 60 38 - 126 U/L   Total Bilirubin 0.3 0.3 - 1.2 mg/dL   Bilirubin, Direct <0.1 (L) 0.1 - 0.5 mg/dL   Indirect Bilirubin NOT CALCULATED 0.3 - 0.9 mg/dL  Protime-INR     Status: Abnormal   Collection Time: 04/08/15  4:48 AM  Result Value Ref Range   Prothrombin  Time 18.5 (H) 11.6 - 15.2 seconds   INR 1.53 (H) 0.00 - 1.49  APTT     Status: None   Collection Time: 04/08/15  4:48 AM  Result Value Ref Range   aPTT 30 24 - 37 seconds  Fibrinogen     Status: None   Collection Time: 04/08/15  4:48 AM  Result Value Ref Range   Fibrinogen 217 204 - 475 mg/dL  Blood gas, arterial     Status: Abnormal   Collection Time: 04/08/15  4:48 AM  Result Value Ref Range   FIO2 0.40    Delivery systems VENTILATOR    Mode PRESSURE REGULATED VOLUME CONTROL    VT 360 mL   LHR 35 resp/min   Peep/cpap 5.0 cm H20   pH, Arterial 7.190 (LL) 7.350 - 7.450    Comment: CRITICAL RESULT CALLED TO, READ BACK BY AND VERIFIED WITH:  [name] HANCOCK, RN AT 0450, BY JAMIE SILVA, RRT, RCP, ON 11/ CRITICAL RESULT CALLED TO, READ BACK BY AND VERIFIED WITH:  [name] HANCOCK, RN AT 0450, BY JAMIE SILVA, RRT, RCP ON 04/08/2015 CRITICAL RESULT CALLED TO, READ BACK BY AND VERIFIED WITH: [name] HANCOCK,RN AT 0450,BY JAMIE SILVA,RRT,RCP ON 04/08/2015    pCO2 arterial 27.5 (L) 35.0 - 45.0 mmHg   pO2, Arterial 191 (H) 80.0 - 100.0 mmHg   Bicarbonate 10.1 (L) 20.0 - 24.0 mEq/L   TCO2 10.9 0 - 100 mmol/L   Acid-base deficit 16.5 (H) 0.0 - 2.0 mmol/L   O2 Saturation 98.8 %   Patient temperature 98.6    Collection site LEFT RADIAL    Drawn by [id number]    Sample type ARTERIAL DRAW    Allens test (pass/fail) PASS PASS  CBC     Status: Abnormal   Collection Time: 04/08/15  4:48 AM  Result Value Ref Range   WBC 29.5 (H) 4.0 - 10.5 K/uL   RBC 5.19 (H) 3.87 - 5.11 MIL/uL   Hemoglobin 15.6 (H) 12.0 - 15.0 g/dL   HCT 48.0 (H) 36.0 - 46.0 %   MCV 92.5 78.0 - 100.0 fL   MCH 30.1 26.0 - 34.0 pg   MCHC 32.5 30.0 - 36.0 g/dL   RDW 13.4 11.5 - 15.5 %   Platelets 301 150 - 400 K/uL  Ammonia  Status: None   Collection Time: 04/08/15  4:49 AM  Result Value Ref Range   Ammonia 34 9 - 35 umol/L   Dg Abd 1 View  04/08/2015  CLINICAL DATA:  Check orogastric catheter placement EXAM: ABDOMEN  - 1 VIEW COMPARISON:  None. FINDINGS: A gastric catheter is noted within the midportion of the stomach. Scattered large and small bowel gas is noted. No free air is seen. No bony abnormality is noted. IMPRESSION: OG catheter within the stomach. Electronically Signed   By: Inez Catalina M.D.   On: 04/08/2015 01:43   Ct Head Wo Contrast  04/08/2015  CLINICAL DATA:  Overdose.  Altered consciousness. EXAM: CT HEAD WITHOUT CONTRAST TECHNIQUE: Contiguous axial images were obtained from the base of the skull through the vertex without intravenous contrast. COMPARISON:  None. FINDINGS: There is no intracranial hemorrhage, mass or evidence of acute infarction. There is no extra-axial fluid collection. Gray matter and white matter appear normal. Cerebral volume is normal for age. Brainstem and posterior fossa are unremarkable. The CSF spaces appear normal. The bony structures are intact. The visible portions of the paranasal sinuses are clear. IMPRESSION: Normal brain Electronically Signed   By: Andreas Newport M.D.   On: 04/08/2015 03:37   Dg Chest Portable 1 View  04/08/2015  CLINICAL DATA:  Status post central line placement EXAM: PORTABLE CHEST - 1 VIEW COMPARISON:  04/07/2015 FINDINGS: Cardiac shadow is within normal limits. A nasogastric catheter is noted extending into the stomach. Endotracheal tube is seen with the tip 3.9 cm above the carina. A right jugular line is noted in the mid superior vena cava. No pneumothorax is seen. No bony abnormality is noted. The lungs are clear. The IMPRESSION: Tubes and lines as described. No acute complicating factors are noted. Electronically Signed   By: Inez Catalina M.D.   On: 04/08/2015 01:44   Dg Chest Portable 1 View  04/07/2015  CLINICAL DATA:  Vomiting.  Overdose. EXAM: PORTABLE CHEST 1 VIEW COMPARISON:  None. FINDINGS: The heart size and mediastinal contours are within normal limits. Both lungs are clear. The visualized skeletal structures are unremarkable.  IMPRESSION: Normal examination. Electronically Signed   By: Claudie Revering M.D.   On: 04/07/2015 21:58    Assessment:  1 Acetaminophen OD 2 Salicylate toxicity 3 Lactic acidosis Plan: 1 Urgent dialysis ordered  Mccade Sullenberger C 04/08/2015, 6:09 AM

## 2015-04-08 NOTE — Progress Notes (Signed)
PULMONARY / CRITICAL CARE MEDICINE   Name: Amber Frey MRN: [id number] DOB: 01-Mar-1996    ADMISSION DATE:  04/07/2015 CONSULTATION DATE:  04/07/15  REFERRING MD :  EDP  CHIEF COMPLAINT:  AMS  INITIAL PRESENTATION:  19 y.o. F brought to [facility] ED 10/31 after intentional overdose of tylenol and aspirin as part of suicide attempt.  In ED, she was intubated for airway protection and started on NAC.   STUDIES:  CXR 10/31 >>> normal.  SIGNIFICANT EVENTS: 10/31 - admitted after intentional overdose of tylenol and aspirin as part of suicide attempt. 11/1- refractory shock, hd emergent, aline, new line placed, levophed  SUBJECTIVE: aggitation, shock  VITAL SIGNS: Temp:  [97.4 F (36.3 C)-103.1 F (39.5 C)] 103.1 F (39.5 C) (11/01 0729) Pulse Rate:  [110-176] 132 (11/01 0842) Resp:  [21-47] 30 (11/01 0842) BP: (64-159)/(30-109) 93/41 mmHg (11/01 0915) SpO2:  [96 %-100 %] 98 % (11/01 0830) FiO2 (%):  [40 %-100 %] 40 % (11/01 0720) Weight:  [48.1 kg (106 lb 0.7 oz)-49.896 kg (110 lb)] 48.4 kg (106 lb 11.2 oz) (11/01 0630) HEMODYNAMICS:   VENTILATOR SETTINGS: Vent Mode:  [-] PRVC FiO2 (%):  [40 %-100 %] 40 % Set Rate:  [20 bmp-36 bmp] 35 bmp Vt Set:  [360 mL] 360 mL PEEP:  [5 cmH20] 5 cmH20 Plateau Pressure:  [11 cmH20-19 cmH20] 11 cmH20 INTAKE / OUTPUT: Intake/Output      10/31 0701 - 11/01 0700 11/01 0701 - 11/02 0700   I.V. (mL/kg) 215.9 (4.5)    IV Piggyback 50    Total Intake(mL/kg) 265.9 (5.5)    Urine (mL/kg/hr) 4950    Total Output 4950     Net -4684.1            PHYSICAL EXAMINATION: General: Young asian female, unresponsive, int agitation, rass -1 to 2 Neuro: perrl sluggish 37m, rass -1 HEENT: no trauma, ett Cardiovascular: Tachy, regular, no M/R/G.  Lungs:  Mild coarseness Abdomen: BS x 4, soft, NT/ND Musculoskeletal: No gross deformities, no edema.  Skin: Intact, warm, no rashes.  LABS:  CBC  Recent Labs Lab 04/07/15 2201 04/08/15 0448  WBC 40.4*  29.5*  HGB 17.2* 15.6*  HCT 51.4* 48.0*  PLT 396 301   Coag's  Recent Labs Lab 04/07/15 2201 04/08/15 0448  APTT  --  30  INR 1.13 1.53*   BMET  Recent Labs Lab 04/07/15 2201 04/08/15 0658  NA 138 148*  K 3.4* 3.3*  CL 108 112*  CO2 7* 17*  BUN 13 11  CREATININE 1.16* 1.38*  GLUCOSE 180* 143*   Electrolytes  Recent Labs Lab 04/07/15 2201 04/08/15 0658  CALCIUM 9.2 7.6*  MG 2.3 1.8  PHOS  --  1.7*   Sepsis Markers  Recent Labs Lab 04/07/15 2213 04/08/15 0656  LATICACIDVEN 8.70* 3.9*   ABG  Recent Labs Lab 04/07/15 2220 04/08/15 0448  PHART 7.139* 7.190*  PCO2ART 19.4* 27.5*  PO2ART 113* 191*   Liver Enzymes  Recent Labs Lab 04/07/15 2201 04/08/15 0448  AST 60* 58*  ALT 30 32  ALKPHOS 87 60  BILITOT 0.8 0.3  ALBUMIN 5.8* 4.1   Cardiac Enzymes  Recent Labs Lab 04/08/15 0658  TROPONINI 0.03   Glucose  Recent Labs Lab 04/08/15 0313 04/08/15 0727  GLUCAP 128* 129*    Imaging Dg Abd 1 View  04/08/2015  CLINICAL DATA:  Check orogastric catheter placement EXAM: ABDOMEN - 1 VIEW COMPARISON:  None. FINDINGS: A gastric catheter is noted within the  midportion of the stomach. Scattered large and small bowel gas is noted. No free air is seen. No bony abnormality is noted. IMPRESSION: OG catheter within the stomach. Electronically Signed   By: Inez Catalina M.D.   On: 04/08/2015 01:43   Ct Head Wo Contrast  04/08/2015  CLINICAL DATA:  Overdose.  Altered consciousness. EXAM: CT HEAD WITHOUT CONTRAST TECHNIQUE: Contiguous axial images were obtained from the base of the skull through the vertex without intravenous contrast. COMPARISON:  None. FINDINGS: There is no intracranial hemorrhage, mass or evidence of acute infarction. There is no extra-axial fluid collection. Gray matter and white matter appear normal. Cerebral volume is normal for age. Brainstem and posterior fossa are unremarkable. The CSF spaces appear normal. The bony structures are  intact. The visible portions of the paranasal sinuses are clear. IMPRESSION: Normal brain Electronically Signed   By: Andreas Newport M.D.   On: 04/08/2015 03:37   Dg Chest Portable 1 View  04/08/2015  CLINICAL DATA:  Status post central line placement EXAM: PORTABLE CHEST - 1 VIEW COMPARISON:  04/07/2015 FINDINGS: Cardiac shadow is within normal limits. A nasogastric catheter is noted extending into the stomach. Endotracheal tube is seen with the tip 3.9 cm above the carina. A right jugular line is noted in the mid superior vena cava. No pneumothorax is seen. No bony abnormality is noted. The lungs are clear. The IMPRESSION: Tubes and lines as described. No acute complicating factors are noted. Electronically Signed   By: Inez Catalina M.D.   On: 04/08/2015 01:44   Dg Chest Portable 1 View  04/07/2015  CLINICAL DATA:  Vomiting.  Overdose. EXAM: PORTABLE CHEST 1 VIEW COMPARISON:  None. FINDINGS: The heart size and mediastinal contours are within normal limits. Both lungs are clear. The visualized skeletal structures are unremarkable. IMPRESSION: Normal examination. Electronically Signed   By: Claudie Revering M.D.   On: 04/07/2015 21:58    ASSESSMENT / PLAN:  GASTROINTESTINAL A:   Tylenol overdose ? Hematemesis, r/o GI bleed Nutrition. P:   NAC per pharmacy, DO NOT dc this, follow LFT Repeat acetaminophen level noted, unclear if at 34 hr ingestion , first level Repeat LFt an in am  Check salicylate level [id number] until trending down, emergent HD Monitor OGT for further signs of bleeding. Pantoprazole gtt. NPO  RENAL A:   Hypokalemia AG + NAG metabolic acidosis - due to ASA + tylenol overdose + lactate + vomiting. AKI. P:   HCO3 @ 75, maintain Check osmoles to calculate osmole gap, pending Check ethylene glycol, methanol sent although no history to support HD now BMP q8h Bolus Get cvp k bath replace phos  PULMONARY OETT 11/1 >>> A: VDRF due to inability to protect airway  following intentional overdose as part of suicide attempt. Respiratory alkalosis with metabolic acidosis - due to ASA ingestion / overdose. Now all uncompensated met acidosis P:   ABG stat now - 7.46/ 34/ 164, reduce rate vent 26, repeat abg Repeat pcxr  No SBT with high MV needs Keep O2 needs same  CARDIOVASCULAR HD cath pending 11/1 >>> A:  Prolonged QTc - in setting hyokalemia. refractory shock from OD, hypovolemia P:  Repeat EKG and assess QTc in AM Trend lactat re assuring Levophed stat Place neck line A line stat Cortisol stat then stress roids replace Mag, phos  HEMATOLOGIC A:   Hemoconcentration. VTE Prophylaxis. P:  SCD's only. CBC in AM. give fluids Repeat coags, in setting tylenol likely to rise  INFECTIOUS A:  Concern for aspiration. P:   Sputum Cx 11/1 >>> Abx:  Zosyn, start date 11/1>>>11/1 unasyn 11/1>>>  BC sent ensure  ENDOCRINE A:   No acute issues.   P:   Monitor glucose on BMP, maintain > 100  NEUROLOGIC A:   Acute metabolic encephalopathy. Intentional overdose as part of suicide attempt. At risk ICp from tyelanl P:   Sedation:  Propofol gtt dc as shock Add fent NO versed in setting liver risk  RASS goal: -1 Daily WUA unable Suicide precautions. Will need psych consult once extubated.   Family updated:mother on way ion to update  Interdisciplinary Family Meeting v Palliative Care Meeting:  Due by: 11/7.  CC time:  120 minutes.   Lavon Paganini. Titus Mould, MD, [place] Pgr: [facility]

## 2015-04-08 NOTE — Progress Notes (Signed)
ANTIBIOTIC CONSULT NOTE - INITIAL  Pharmacy Consult for Zosyn Indication:  Aspiration  No Known Allergies  Patient Measurements: Height: 5' (152.4 cm) Weight: 110 lb (49.896 kg) IBW/kg (Calculated) : 45.5   Vital Signs: Temp: 97.4 F (36.3 C) (10/31 2345) BP: 134/74 mmHg (11/01 0030) Pulse Rate: 128 (11/01 0030) Intake/Output from previous day: 10/31 0701 - 11/01 0700 In: -  Out: 2000 [Urine:2000] Intake/Output from this shift: Total I/O In: -  Out: 2000 [Urine:2000]  Labs:  Recent Labs  04/07/15 2201  WBC 40.4*  HGB 17.2*  PLT 396  CREATININE 1.16*   Estimated Creatinine Clearance: 55.1 mL/min (by C-G formula based on Cr of 1.16). No results for input(s): VANCOTROUGH, VANCOPEAK, VANCORANDOM, GENTTROUGH, GENTPEAK, GENTRANDOM, TOBRATROUGH, TOBRAPEAK, TOBRARND, AMIKACINPEAK, AMIKACINTROU, AMIKACIN in the last 72 hours.   Microbiology: No results found for this or any previous visit (from the past 720 hour(s)).  Medical History: History reviewed. No pertinent past medical history.  Medications:   (Not in a hospital admission) Scheduled:   Infusions:  . sodium chloride    . acetylcysteine 15 mg/kg/hr (04/07/15 2357)  . pantoprozole (PROTONIX) infusion Stopped (04/08/15 0015)  . piperacillin-tazobactam (ZOSYN)  IV    . potassium chloride Stopped (04/08/15 0015)  . propofol (DIPRIVAN) infusion 5 mcg/kg/min (04/08/15 0026)  .  sodium bicarbonate 150 mEq in sterile water 1000 mL infusion     Assessment: 19 yoF s/p drug OD/suicide attempt.  Zosyn per Rx for aspiration.   Goal of Therapy:  Empiric   Plan:   Zosyn 3.375 Gm IV q8h EI  F/u SCr/cultures as needed  [name], [name] 04/08/2015,12:49 AM

## 2015-04-08 NOTE — ED Notes (Signed)
Gave report to NellieJason with [facility].

## 2015-04-08 NOTE — ED Notes (Signed)
Provided Jeanella FlatteryMarybeth, RN with an updated report. [facility] still at bedside.

## 2015-04-08 NOTE — ED Notes (Signed)
ZOSYN was taken out of Pyxis and [facility] for ICU to start when more available channels.

## 2015-04-08 NOTE — Progress Notes (Signed)
MEDICATION RELATED CONSULT NOTE - INITIAL   Pharmacy Consult for Acetadote Indication: Apap OD  No Known Allergies  Patient Measurements: Height: 5' (152.4 cm) Weight: 110 lb (49.896 kg) IBW/kg (Calculated) : 45.5   Vital Signs: Temp: 97.4 F (36.3 C) (10/31 2345) BP: 134/74 mmHg (11/01 0030) Pulse Rate: 128 (11/01 0030) Intake/Output from previous day: 10/31 0701 - 11/01 0700 In: -  Out: 2000 [Urine:2000] Intake/Output from this shift: Total I/O In: -  Out: 2000 [Urine:2000]  Labs:  Recent Labs  04/07/15 2201  WBC 40.4*  HGB 17.2*  HCT 51.4*  PLT 396  CREATININE 1.16*  MG 2.3  ALBUMIN 5.8*  PROT 9.8*  AST 60*  ALT 30  ALKPHOS 87  BILITOT 0.8   Estimated Creatinine Clearance: 55.1 mL/min (by C-G formula based on Cr of 1.16).   Microbiology: No results found for this or any previous visit (from the past 720 hour(s)).  Medical History: History reviewed. No pertinent past medical history.  Medications:   (Not in a hospital admission) Scheduled:   Infusions:  . sodium chloride    . acetylcysteine 15 mg/kg/hr (04/07/15 2357)  . pantoprozole (PROTONIX) infusion Stopped (04/08/15 0015)  . piperacillin-tazobactam (ZOSYN)  IV    . potassium chloride Stopped (04/08/15 0015)  . propofol (DIPRIVAN) infusion 5 mcg/kg/min (04/08/15 0026)  .  sodium bicarbonate 150 mEq in sterile water 1000 mL infusion 75 mL/hr at 04/08/15 0015    Assessment: 19 yoF s/p OD of Apap, ASA, Sudafed and unknown OTC HA med.  Apap=217 and Salicylate=45.5 on admission.   Goal of Therapy:  Prevent liver toxicity  Plan:   Acetadote 150mg /kg load x1  Start drip @ 15 mg/kg/hr (started ~ 2315)  Spoke with PC ~0030  Recommends q3h salicylate and Apap in am  [name], Kylea Berrong R 04/08/2015,12:52 AM

## 2015-04-08 NOTE — Progress Notes (Signed)
eLink Physician-Brief Progress Note Patient Name: Westly Pamrang Kunda DOB: 12/25/1995 MRN: [id number]   Date of Service  04/08/2015  HPI/Events of Note  Labs ordered at [facility] not drawn at [facility].   eICU Interventions  Will order labs that did not cross over with the patient on transfer [facility].     Intervention Category Minor Interventions: Clinical assessment - ordering diagnostic tests  Lenell AntuSommer,Steven Eugene 04/08/2015, 6:21 AM

## 2015-04-08 NOTE — Procedures (Signed)
Central Venous Catheter Insertion Procedure Note Amber Frey [id number] 05/27/1996  Procedure: Insertion of Central Venous Catheter Indications: Assessment of intravascular volume, Drug and/or fluid administration and Frequent blood sampling  Procedure Details Consent: Unable to obtain consent because of emergent medical necessity. Time Out: Verified patient identification, verified procedure, site/side was marked, verified correct patient position, special equipment/implants available, medications/allergies/relevent history reviewed, required imaging and test results available.  Performed  Maximum sterile technique was used including antiseptics, cap, gloves, gown, hand hygiene, mask and sheet. Skin prep: Chlorhexidine; local anesthetic administered A antimicrobial bonded/coated triple lumen catheter was placed in the left internal jugular vein using the Seldinger technique.  Evaluation Blood flow good Complications: No apparent complications Patient did tolerate procedure well. Chest X-ray ordered to verify placement.  CXR: pending.  Amber Frey,Amber Frey. 04/08/2015, 9:18 AM  US  Amber Frey [name], MD, FACP Pgr: [phone] [facility]

## 2015-04-08 NOTE — Procedures (Signed)
I was present at this dialysis session. I have reviewed the session itself and made appropriate changes.   Pt developed hypotension at onset of HD, has rec multiple fluid boluses.  T dropped to 2.25.  Rec central line now and will start vasopressors.  iHD is strongly preferred over CRRT for saliclate toxicity, will try to improve hemodynamics and continue.    Pt w evidence of end organ damage (CNS, Lungs) and worsening GFR.    Sabra Heckyan Ithiel Liebler  MD 04/08/2015, 8:48 AM

## 2015-04-08 NOTE — Progress Notes (Signed)
eLink Physician-Brief Progress Note Patient Name: Amber Frey DOB: 04/06/1996 MRN: [id number]   Date of Service  04/08/2015  HPI/Events of Note  Spoke with Poison Control - they have 2 concerns: 1. Degree of acidosis (HCO3- in the single digits) and 2. Degree of altered mental status is not what would be expected from Tylenol/ASA OD. They recommended several studies and interventions. Ethylene glycol level and volatile alcohol screen have already been sent. The patient is also already on a NaHCO3 IV infusion and is being transferred [facility] for [facility] HD/CV[facility]HD.  eICU Interventions  Will order additional interventions and studies as recommended by Poison Control: 1. Head CT Scan w/o contrast now.  2. Fomepizole 15 mg/kg load now, then 10 mg/kg Q 12 hours.      Intervention Category Major Interventions: Acid-Base disturbance - evaluation and management  Sommer,Steven Eugene 04/08/2015, 2:12 AM

## 2015-04-08 NOTE — ED Notes (Signed)
[facility] has left facility en route to [facility].

## 2015-04-08 NOTE — Progress Notes (Signed)
eLink Physician-Brief Progress Note Patient Name: Amber Frey DOB: 05/06/1996 MRN: [id number]   Date of Service  04/08/2015  HPI/Events of Note  ABG = 7.19/27.5/191/10.1. Already being hyperventilated and is on a NaHCO3 IV infusion.  HD planned.   eICU Interventions  Will order: 1. NaHCO3 100 meq IV now.  2. ABG at 8 AM.     Intervention Category Major Interventions: Acid-Base disturbance - evaluation and management  Sommer,Steven Eugene 04/08/2015, 4:50 AM

## 2015-04-08 NOTE — Progress Notes (Addendum)
ANTIBIOTIC CONSULT NOTE - INITIAL  Pharmacy Consult for Unasyn Indication: aspiration pneumonia  No Known Allergies  Patient Measurements: Height: 5' (152.4 cm) Weight: 106 lb 11.2 oz (48.4 kg) IBW/kg (Calculated) : 45.5  Vital Signs: Temp: 103.1 F (39.5 C) (11/01 0729) Temp Source: Oral (11/01 0729) BP: 104/48 mmHg (11/01 0944) Pulse Rate: 131 (11/01 0944) Intake/Output from previous day: 10/31 0701 - 11/01 0700 In: 265.9 [I.V.:215.9; IV Piggyback:50] Out: 4950 [Urine:4950] Intake/Output from this shift: Total I/O In: 3000 [I.V.:3000] Out: -   Labs:  Recent Labs  04/07/15 2201 04/08/15 0448 04/08/15 0658  WBC 40.4* 29.5*  --   HGB 17.2* 15.6*  --   PLT 396 301  --   CREATININE 1.16*  --  1.38*   Estimated Creatinine Clearance: 46.3 mL/min (by C-G formula based on Cr of 1.38). No results for input(s): VANCOTROUGH, VANCOPEAK, VANCORANDOM, GENTTROUGH, GENTPEAK, GENTRANDOM, TOBRATROUGH, TOBRAPEAK, TOBRARND, AMIKACINPEAK, AMIKACINTROU, AMIKACIN in the last 72 hours.   Microbiology: Recent Results (from the past 720 hour(s))  MRSA PCR Screening     Status: None   Collection Time: 04/08/15  3:36 AM  Result Value Ref Range Status   MRSA by PCR NEGATIVE NEGATIVE Final    Comment:        The GeneXpert MRSA Assay (FDA approved for NASAL specimens only), is one component of a comprehensive MRSA colonization surveillance program. It is not intended to diagnose MRSA infection nor to guide or monitor treatment for MRSA infections.     Medical History: History reviewed. No pertinent past medical history.  Medications:  Scheduled:  . ampicillin-sulbactam (UNASYN) IV  1.5 g Intravenous Q8H  . antiseptic oral rinse  7 mL Mouth Rinse QID  . chlorhexidine gluconate  15 mL Mouth Rinse BID  . etomidate      . fentaNYL (SUBLIMAZE) injection  50 mcg Intravenous Once  . fomepizole (ANTIZOL) IV  10 mg/kg Intravenous Q12H  . hydrocortisone sod succinate (SOLU-CORTEF) inj   50 mg Intravenous Q6H  . magnesium sulfate LVP 250-500 ml  2 g Intravenous Once  . midazolam      . potassium phosphate IVPB (mEq)  20 mEq Intravenous Once   Assessment: 19 yoF s/p intentional OD of Apap, ASA, Sudafed and ? OTC HA med of unknown quantities.  Unasyn for possible aspiration pneumonia. Tmax 103.1, WBC improving from 40.4 to 21.7.  Pt receiving HD for toxin removal, not renal function. SCr improved 1.38 > 0.53 with CrCl ~80 ml/min therefore not necessary to dose for HD.  11/1 >> 11/1 zosyn  11/1 > Unasyn   11/1 MRSA PCR negative  11/1: trach aspirate sent  Goal of Therapy:  R/o aspiration PNA  Plan:  Unasyn 1.5 g IV q8h Monitor clinical progression, cultures, LOT  Greggory Stallionristy Reyes, PharmD Clinical Pharmacy Resident Pager # [phone] 04/08/2015 9:47 AM

## 2015-04-08 NOTE — ED Provider Notes (Signed)
Medical screening examination/treatment/procedure(s) were conducted as a shared visit with non-physician practitioner(s) and myself.  I personally evaluated the patient during the encounter.   EKG Interpretation None     Pt intubated at request of critical care. See Sharilyn SitesLisa Sanders note. I was present for entire procedure and assisted.   Raeford RazorStephen Irvine Glorioso, MD 04/08/15 [phone]

## 2015-04-08 NOTE — Progress Notes (Addendum)
Critical values received and relayed to Dr. Tyson [name]. Expected results, trending down.  No new orders at this time.

## 2015-04-08 NOTE — Progress Notes (Signed)
CRITICAL VALUE ALERT  Critical value received:  Ph 7.19  Date of notification:  04/08/2015  Time of notification:  0444  Critical value read back:Yes.    Nurse who received alert:  Lillia CorporalHancock, Daaiyah Baumert Elizabeth  MD notified (1st page):  Dr. Arsenio LoaderSommer  Time of first page:  [phone]  MD notified (2nd page):  Time of second page:  Responding MD:  Dr. Arsenio LoaderSommer  Time MD responded:  [phone]

## 2015-04-08 NOTE — ED Provider Notes (Signed)
  INTUBATION Performed by: Garlon HatchetSANDERS, Jamaury Gumz M  Required items: required blood products, implants, devices, and special equipment available Patient identity confirmed: provided demographic data and hospital-assigned identification number Time out: Immediately prior to procedure a "time out" was called to verify the correct patient, procedure, equipment, support staff and site/side marked as required.  Indications: airway protection, aspiration risk  Intubation method: Glidescope Laryngoscopy   Preoxygenation: BVM  Sedatives: Etomidate Paralytic: Succinylcholine  Tube Size: 7.5 cuffed  Post-procedure assessment: chest rise and ETCO2 monitor Breath sounds: equal and absent over the epigastrium Tube secured with: ETT holder  Portable CXR to be obtained for verification of proper positioning.  CCM to follow-up.  Patient tolerated the procedure well with no immediate complications.   Garlon HatchetLisa M Cruze Zingaro, PA-C 04/08/15 [id number]  Raeford RazorStephen Kohut, MD 04/08/15 [phone]

## 2015-04-08 NOTE — Procedures (Signed)
Arterial Catheter Insertion Procedure Note Westly Pamrang Vetrano [id number] 06/30/1995  Procedure: Insertion of Arterial Catheter  Indications: Blood pressure monitoring and Frequent blood sampling  Procedure Details Consent: Unable to obtain consent because of emergent medical necessity. Time Out: Verified patient identification, verified procedure, site/side was marked, verified correct patient position, special equipment/implants available, medications/allergies/relevent history reviewed, required imaging and test results available.  Performed  Maximum sterile technique was used including antiseptics, cap, gloves, gown, hand hygiene, mask and sheet. Skin prep: Chlorhexidine; local anesthetic administered 20 gauge catheter was inserted into right femoral artery using the Seldinger technique.  Evaluation Blood flow good; BP tracing good. Complications: No apparent complications.   [name],DANIEL J. 04/08/2015  US  [name] [name], MD, FACP Pgr: [phone] [facility]

## 2015-04-09 ENCOUNTER — Inpatient Hospital Stay (HOSPITAL_COMMUNITY): Payer: BLUE CROSS/BLUE SHIELD

## 2015-04-09 DIAGNOSIS — J9600 Acute respiratory failure, unspecified whether with hypoxia or hypercapnia: Secondary | ICD-10-CM

## 2015-04-09 DIAGNOSIS — T391X1A Poisoning by 4-Aminophenol derivatives, accidental (unintentional), initial encounter: Secondary | ICD-10-CM

## 2015-04-09 LAB — HEPATIC FUNCTION PANEL
ALBUMIN: 2.7 g/dL — AB (ref 3.5–5.0)
ALBUMIN: 2.9 g/dL — AB (ref 3.5–5.0)
ALK PHOS: 44 U/L (ref 38–126)
ALT: 46 U/L (ref 14–54)
ALT: 52 U/L (ref 14–54)
AST: 75 U/L — ABNORMAL HIGH (ref 15–41)
AST: 79 U/L — ABNORMAL HIGH (ref 15–41)
Alkaline Phosphatase: 46 U/L (ref 38–126)
BILIRUBIN TOTAL: 1.1 mg/dL (ref 0.3–1.2)
Bilirubin, Direct: <0.1 mg/dL — ABNORMAL LOW (ref 0.1–0.5)
Bilirubin, Direct: <0.1 mg/dL — ABNORMAL LOW (ref 0.1–0.5)
TOTAL PROTEIN: 5.3 g/dL — AB (ref 6.5–8.1)
Total Bilirubin: 1.1 mg/dL (ref 0.3–1.2)
Total Protein: 4.8 g/dL — ABNORMAL LOW (ref 6.5–8.1)

## 2015-04-09 LAB — CBC
HCT: 21.6 % — ABNORMAL LOW (ref 36.0–46.0)
HCT: 33.9 % — ABNORMAL LOW (ref 36.0–46.0)
HEMATOCRIT: 33.6 % — AB (ref 36.0–46.0)
HEMOGLOBIN: 11.1 g/dL — AB (ref 12.0–15.0)
Hemoglobin: 7.1 g/dL — ABNORMAL LOW (ref 12.0–15.0)
Hemoglobin: 11 g/dL — ABNORMAL LOW (ref 12.0–15.0)
MCH: 29.6 pg (ref 26.0–34.0)
MCH: 29.7 pg (ref 26.0–34.0)
MCH: 29 pg (ref 26.0–34.0)
MCHC: 33 g/dL (ref 30.0–36.0)
MCHC: 32.9 g/dL (ref 30.0–36.0)
MCHC: 32.4 g/dL (ref 30.0–36.0)
MCV: 89.6 fL (ref 78.0–100.0)
MCV: 90.4 fL (ref 78.0–100.0)
MCV: 89.4 fL (ref 78.0–100.0)
PLATELETS: 81 10*3/uL — AB (ref 150–400)
PLATELETS: 132 10*3/uL — AB (ref 150–400)
Platelets: 135 10*3/uL — ABNORMAL LOW (ref 150–400)
RBC: 3.75 MIL/uL — AB (ref 3.87–5.11)
RBC: 2.39 MIL/uL — ABNORMAL LOW (ref 3.87–5.11)
RBC: 3.79 MIL/uL — AB (ref 3.87–5.11)
RDW: 13.6 % (ref 11.5–15.5)
RDW: 13.6 % (ref 11.5–15.5)
RDW: 13.5 % (ref 11.5–15.5)
WBC: 10.6 10*3/uL — AB (ref 4.0–10.5)
WBC: 6.9 10*3/uL (ref 4.0–10.5)
WBC: 9.4 10*3/uL (ref 4.0–10.5)

## 2015-04-09 LAB — BASIC METABOLIC PANEL
ANION GAP: 11 (ref 5–15)
ANION GAP: 6 (ref 5–15)
ANION GAP: 11 (ref 5–15)
ANION GAP: 7 (ref 5–15)
BUN: <5 mg/dL — ABNORMAL LOW (ref 6–20)
BUN: <5 mg/dL — ABNORMAL LOW (ref 6–20)
BUN: <5 mg/dL — ABNORMAL LOW (ref 6–20)
CALCIUM: 7.6 mg/dL — AB (ref 8.9–10.3)
CALCIUM: 8.8 mg/dL — AB (ref 8.9–10.3)
CO2: 30 mmol/L (ref 22–32)
CO2: 17 mmol/L — AB (ref 22–32)
CO2: 28 mmol/L (ref 22–32)
CO2: 29 mmol/L (ref 22–32)
Calcium: 4.8 mg/dL — CL (ref 8.9–10.3)
Calcium: 8.1 mg/dL — ABNORMAL LOW (ref 8.9–10.3)
Chloride: 99 mmol/L — ABNORMAL LOW (ref 101–111)
Chloride: 121 mmol/L — ABNORMAL HIGH (ref 101–111)
Chloride: 101 mmol/L (ref 101–111)
Chloride: 101 mmol/L (ref 101–111)
Creatinine, Ser: 0.85 mg/dL (ref 0.44–1.00)
Creatinine, Ser: 0.52 mg/dL (ref 0.44–1.00)
Creatinine, Ser: 0.92 mg/dL (ref 0.44–1.00)
Creatinine, Ser: 0.77 mg/dL (ref 0.44–1.00)
GFR calc Af Amer: >60 mL/min (ref >60)
GFR calc Af Amer: >60 mL/min (ref >60)
GFR calc non Af Amer: >60 mL/min (ref >60)
GLUCOSE: 126 mg/dL — AB (ref 65–99)
Glucose, Bld: 78 mg/dL (ref 65–99)
Glucose, Bld: 40 mg/dL — CL (ref 65–99)
Glucose, Bld: 96 mg/dL (ref 65–99)
POTASSIUM: 3.1 mmol/L — AB (ref 3.5–5.1)
POTASSIUM: 2.4 mmol/L — AB (ref 3.5–5.1)
POTASSIUM: 4.1 mmol/L (ref 3.5–5.1)
POTASSIUM: 4.4 mmol/L (ref 3.5–5.1)
SODIUM: 140 mmol/L (ref 135–145)
SODIUM: 144 mmol/L (ref 135–145)
SODIUM: 137 mmol/L (ref 135–145)
Sodium: 140 mmol/L (ref 135–145)

## 2015-04-09 LAB — PROTIME-INR
INR: 1.52 — ABNORMAL HIGH (ref 0.00–1.49)
INR: 2.34 — AB (ref 0.00–1.49)
INR: 1.33 (ref 0.00–1.49)
PROTHROMBIN TIME: 18.4 s — AB (ref 11.6–15.2)
PROTHROMBIN TIME: 25.4 s — AB (ref 11.6–15.2)
PROTHROMBIN TIME: 16.6 s — AB (ref 11.6–15.2)

## 2015-04-09 LAB — POCT I-STAT 3, ART BLOOD GAS (G3+)
Acid-base deficit: 1 mmol/L (ref 0.0–2.0)
Bicarbonate: 25.6 mEq/L — ABNORMAL HIGH (ref 20.0–24.0)
O2 Saturation: 95 %
PCO2 ART: 50.4 mmHg — AB (ref 35.0–45.0)
PH ART: 7.314 — AB (ref 7.350–7.450)
Patient temperature: 98.6
TCO2: 27 mmol/L (ref 0–100)
pO2, Arterial: 83 mmHg (ref 80.0–100.0)

## 2015-04-09 LAB — GLUCOSE, CAPILLARY: GLUCOSE-CAPILLARY: 205 mg/dL — AB (ref 65–99)

## 2015-04-09 LAB — BLOOD GAS, ARTERIAL
ACID-BASE EXCESS: 1.9 mmol/L (ref 0.0–2.0)
Bicarbonate: 26.2 mEq/L — ABNORMAL HIGH (ref 20.0–24.0)
DRAWN BY: 24487
FIO2: 0.3
O2 Saturation: 95 %
PEEP: 5 cmH2O
PH ART: 7.398 (ref 7.350–7.450)
Patient temperature: 98.6
RATE: 12 resp/min
TCO2: 27.6 mmol/L (ref 0–100)
VT: 360 mL
pCO2 arterial: 43.5 mmHg (ref 35.0–45.0)
pO2, Arterial: 76.2 mmHg — ABNORMAL LOW (ref 80.0–100.0)

## 2015-04-09 LAB — FIBRINOGEN
FIBRINOGEN: 170 mg/dL — AB (ref 204–475)
FIBRINOGEN: 402 mg/dL (ref 204–475)
Fibrinogen: 283 mg/dL (ref 204–475)

## 2015-04-09 LAB — CG4 I-STAT (LACTIC ACID)
LACTIC ACID, VENOUS: 1.88 mmol/L (ref 0.5–2.0)
LACTIC ACID, VENOUS: 1.96 mmol/L (ref 0.5–2.0)

## 2015-04-09 LAB — PROCALCITONIN: PROCALCITONIN: 1.3 ng/mL

## 2015-04-09 LAB — APTT
APTT: 51 s — AB (ref 24–37)
aPTT: 36 seconds (ref 24–37)
aPTT: 38 seconds — ABNORMAL HIGH (ref 24–37)

## 2015-04-09 LAB — ACETAMINOPHEN LEVEL

## 2015-04-09 LAB — TRIGLYCERIDES: Triglycerides: 145 mg/dL (ref <150)

## 2015-04-09 LAB — HEPATITIS B SURFACE ANTIGEN: HEP B S AG: NEGATIVE

## 2015-04-09 LAB — MAGNESIUM: Magnesium: 1.1 mg/dL — ABNORMAL LOW (ref 1.7–2.4)

## 2015-04-09 LAB — PHOSPHORUS: Phosphorus: 1.2 mg/dL — ABNORMAL LOW (ref 2.5–4.6)

## 2015-04-09 MED ORDER — PANTOPRAZOLE SODIUM 40 MG IV SOLR
40.0000 mg | Freq: Two times a day (BID) | INTRAVENOUS | Status: DC
  Administered 2015-04-09 – 2015-04-11 (×4): 40 mg via INTRAVENOUS
  Filled 2015-04-09 (×6): qty 40

## 2015-04-09 MED ORDER — KCL IN DEXTROSE-NACL 20-5-0.9 MEQ/L-%-% IV SOLN
INTRAVENOUS | Status: DC
  Administered 2015-04-09: 12:00:00 via INTRAVENOUS
  Filled 2015-04-09 (×2): qty 1000

## 2015-04-09 MED ORDER — FUROSEMIDE 10 MG/ML IJ SOLN
10.0000 mg | Freq: Once | INTRAMUSCULAR | Status: AC
  Administered 2015-04-09: 10 mg via INTRAVENOUS
  Filled 2015-04-09: qty 2

## 2015-04-09 MED ORDER — POTASSIUM CHLORIDE 10 MEQ/100ML IV SOLN: 10.0000 meq | INTRAVENOUS | Status: AC

## 2015-04-09 MED ORDER — DIPHENHYDRAMINE HCL 50 MG/ML IJ SOLN: 25.0000 mg | Freq: Four times a day (QID) | INTRAMUSCULAR | Status: DC | PRN

## 2015-04-09 MED ORDER — POTASSIUM CHLORIDE 10 MEQ/100ML IV SOLN
10.0000 meq | INTRAVENOUS | Status: AC
  Administered 2015-04-09 (×5): 10 meq via INTRAVENOUS
  Filled 2015-04-09 (×5): qty 100

## 2015-04-09 MED ORDER — POTASSIUM CHLORIDE 10 MEQ/50ML IV SOLN
10.0000 meq | INTRAVENOUS | Status: AC
  Administered 2015-04-09 (×6): 10 meq via INTRAVENOUS
  Filled 2015-04-09 (×6): qty 50

## 2015-04-09 MED ORDER — ALBUTEROL SULFATE (2.5 MG/3ML) 0.083% IN NEBU
2.5000 mg | INHALATION_SOLUTION | RESPIRATORY_TRACT | Status: DC | PRN
  Administered 2015-04-09: 2.5 mg via RESPIRATORY_TRACT
  Filled 2015-04-09: qty 3

## 2015-04-09 MED ORDER — DEXTROSE 5 % IV SOLN
30.0000 meq | Freq: Once | INTRAVENOUS | Status: AC
  Administered 2015-04-09: 30 meq via INTRAVENOUS
  Filled 2015-04-09: qty 6.82

## 2015-04-09 MED ORDER — DEXTROSE 50 % IV SOLN
INTRAVENOUS | Status: AC
  Filled 2015-04-09: qty 50

## 2015-04-09 MED ORDER — DEXTROSE 50 % IV SOLN
25.0000 g | Freq: Once | INTRAVENOUS | Status: AC
  Administered 2015-04-09: 25 g via INTRAVENOUS

## 2015-04-09 MED ORDER — MAGNESIUM SULFATE 50 % IJ SOLN
3.0000 g | Freq: Once | INTRAVENOUS | Status: DC
  Filled 2015-04-09: qty 6

## 2015-04-09 MED ORDER — SODIUM CHLORIDE 0.9 % IV SOLN
2.0000 g | Freq: Once | INTRAVENOUS | Status: AC
  Administered 2015-04-09: 2 g via INTRAVENOUS
  Filled 2015-04-09: qty 20

## 2015-04-09 MED ORDER — DIPHENHYDRAMINE HCL 25 MG PO CAPS: 25.0000 mg | ORAL_CAPSULE | Freq: Four times a day (QID) | ORAL | Status: DC | PRN

## 2015-04-09 NOTE — Progress Notes (Signed)
Repeat blood sugar after D50 injection is 205. Will continue with current plan of care and monitor patient.

## 2015-04-09 NOTE — Progress Notes (Signed)
eLink Physician-Brief Progress Note Patient Name: Amber Frey DOB: 01/01/1996 MRN: [id number]   Date of Service  04/09/2015  HPI/Events of Note  Hypokalemia  eICU Interventions  Potassium replaced     Intervention Category Intermediate Interventions: Electrolyte abnormality - evaluation and management  Clydell Sposito 04/09/2015, 2:27 AM

## 2015-04-09 NOTE — Progress Notes (Signed)
CVP Qshift per MD

## 2015-04-09 NOTE — Progress Notes (Signed)
Fentanyl 225 ml wasted in sink and witnessed with second RN, Milas HockKay Sharpe.

## 2015-04-09 NOTE — Progress Notes (Signed)
CRITICAL VALUE ALERT  Critical value received: Potassium=2.4; Glucose= 40; Ca=4.8  Date of notification: 04/09/2015  Time of notification:  10:50  Critical value read back: yes  Nurse who received alert: Cher NakaiLaTanya Delrico Minehart   MD notified (1st page):  Dr. Tyson [name] in person  Time of first page:  10:52  MD notified (2nd page):  Time of second page:  Responding MD:    Time MD responded:

## 2015-04-09 NOTE — Progress Notes (Signed)
Patient extubated to 3L nasal cannula. Patient follows commands. Lungs clear. Family at bedside.

## 2015-04-09 NOTE — Progress Notes (Signed)
CRITICAL VALUE ALERT  Critical value received: Hemoglobin=7.1; Platelets=81  Date of notification:  04/09/2015  Time of notification:  10:39  Critical value read back: yes  Nurse who received alert: Cher NakaiLaTanya Caleigha Zale   MD notified (1st page): Dr. Tyson [name] in person  Time of first page:  10:39  MD notified (2nd page):  Time of second page:  Responding MD:     Time MD responded:

## 2015-04-09 NOTE — Progress Notes (Signed)
eLink Physician-Brief Progress Note Patient Name: [name] DOB: 08/28/1995 MRN: [id number]   Date of Service  04/09/2015  HPI/Events of Note  Hypokalemia  eICU Interventions  Potassium replaced     Intervention Category Intermediate Interventions: Electrolyte abnormality - evaluation and management  Brennley Curtice 04/09/2015, 2:29 AM

## 2015-04-09 NOTE — Progress Notes (Signed)
eLink Physician-Brief Progress Note Patient Name: Amber Frey Icard DOB: 09/06/1995 MRN: [id number]   Date of Service  04/09/2015  HPI/Events of Note  K+ = 4.4. Has K3PO4 IV infusion running. Also has another 30 meq of KCl ordered.  eICU Interventions  Will D/C KCl and hold K3PO4. Recheck BMP at midnight.     Intervention Category Major Interventions: Electrolyte abnormality - evaluation and management  Shaneen Reeser Eugene 04/09/2015, 9:03 PM

## 2015-04-09 NOTE — Clinical Documentation Improvement (Signed)
Critical Care  (please document your query response in the progress notes and discharge summary, not on the query form itself.)  Query 1 of 2 Please document the TYPE of shock in the progress notes and discharge summary.  Clinical Information: "Shock" is documented in the CCM progress note dated 04/08/15 and is being treated with Levophed gtt.  Query 2 of 2  Please document the ACUITY and TYPE of respiratory failure in the progress notes and discharge summary.  Clinical Information: "VDRF" is documented in the H&P Patient was intubated, placed on ventilator and continues on ventilator support.   Please exercise your independent, professional judgment when responding. A specific answer is not anticipated or expected.   Thank You,  Jerral Ralphathy R Aeron Lheureux  RN BSN CCDS [phone] Health Information Management [place]

## 2015-04-09 NOTE — Progress Notes (Signed)
S: Patient is alert and interactive. Still being ventilated but able to write responses. NAD.   O: BP 100/51 mmHg  Pulse 88  Temp(Src) 98.7 F (37.1 C) (Oral)  Resp 15  Ht 5' (1.524 m)  Wt 108 lb (48.988 kg)  BMI 21.09 kg/m2  SpO2 99%  LMP   Gen: resting comfortably in bed, NAD Cardio: RRR, no mr/r/g Lungs: Good air movement, rales appreciated MSK: no edema Skin: warm dry intact  Results for orders placed or performed during the hospital encounter of 04/07/15 (from the past 24 hour(s))  CG4 I-STAT (Lactic acid)     Status: None   Collection Time: 04/08/15  9:15 AM  Result Value Ref Range   Lactic Acid, Venous 1.88 0.5 - 2.0 mmol/L  Salicylate level     Status: None   Collection Time: 04/08/15  9:18 AM  Result Value Ref Range   Salicylate Lvl 6.9 2.8 - 30.0 mg/dL  CG4 I-STAT (Lactic acid)     Status: None   Collection Time: 04/08/15  9:19 AM  Result Value Ref Range   Lactic Acid, Venous 1.96 0.5 - 2.0 mmol/L  I-STAT 3, arterial blood gas (G3+)     Status: Abnormal   Collection Time: 04/08/15  9:26 AM  Result Value Ref Range   pH, Arterial 7.468 (H) 7.350 - 7.450   pCO2 arterial 34.2 (L) 35.0 - 45.0 mmHg   pO2, Arterial 164.0 (H) 80.0 - 100.0 mmHg   Bicarbonate 24.8 (H) 20.0 - 24.0 mEq/L   TCO2 26 0 - 100 mmol/L   O2 Saturation 100.0 %   Acid-Base Excess 1.0 0.0 - 2.0 mmol/L   Patient temperature 98.6 F    Collection site FEMORAL ARTERY    Drawn by VP    Sample type ARTERIAL   Culture, blood (routine x 2)     Status: None (Preliminary result)   Collection Time: 04/08/15 10:20 AM  Result Value Ref Range   Specimen Description BLOOD CENTRAL LINE    Special Requests BOTTLES DRAWN AEROBIC AND ANAEROBIC 10CC    Culture PENDING    Report Status PENDING   Hepatic function panel     Status: Abnormal   Collection Time: 04/08/15 10:41 AM  Result Value Ref Range   Total Protein 5.3 (L) 6.5 - 8.1 g/dL   Albumin 3.2 (L) 3.5 - 5.0 g/dL   AST 50 (H) 15 - 41 U/L   ALT 27 14  - 54 U/L   Alkaline Phosphatase 47 38 - 126 U/L   Total Bilirubin 0.7 0.3 - 1.2 mg/dL   Bilirubin, Direct 0.1 0.1 - 0.5 mg/dL   Indirect Bilirubin 0.6 0.3 - 0.9 mg/dL  Protime-INR     Status: Abnormal   Collection Time: 04/08/15 10:41 AM  Result Value Ref Range   Prothrombin Time 18.1 (H) 11.6 - 15.2 seconds   INR 1.50 (H) 0.00 - 1.49  APTT     Status: None   Collection Time: 04/08/15 10:41 AM  Result Value Ref Range   aPTT 36 24 - 37 seconds  Fibrinogen     Status: Abnormal   Collection Time: 04/08/15 10:41 AM  Result Value Ref Range   Fibrinogen 176 (L) 204 - 475 mg/dL  CBC     Status: Abnormal   Collection Time: 04/08/15 10:41 AM  Result Value Ref Range   WBC 21.7 (H) 4.0 - 10.5 K/uL   RBC 4.36 3.87 - 5.11 MIL/uL   Hemoglobin 12.9 12.0 - 15.0 g/dL  HCT 38.6 36.0 - 46.0 %   MCV 88.5 78.0 - 100.0 fL   MCH 29.6 26.0 - 34.0 pg   MCHC 33.4 30.0 - 36.0 g/dL   RDW 40.9 81.1 - 91.4 %   Platelets 234 150 - 400 K/uL  Lactic acid, plasma     Status: None   Collection Time: 04/08/15 10:45 AM  Result Value Ref Range   Lactic Acid, Venous 1.5 0.5 - 2.0 mmol/L  Basic metabolic panel     Status: Abnormal   Collection Time: 04/08/15 10:48 AM  Result Value Ref Range   Sodium 138 135 - 145 mmol/L   Potassium 3.6 3.5 - 5.1 mmol/L   Chloride 101 101 - 111 mmol/L   CO2 26 22 - 32 mmol/L   Glucose, Bld 90 65 - 99 mg/dL   BUN <5 (L) 6 - 20 mg/dL   Creatinine, Ser 7.82 0.44 - 1.00 mg/dL   Calcium 7.2 (L) 8.9 - 10.3 mg/dL   GFR calc non Af Amer >60 >60 mL/min   GFR calc Af Amer >60 >60 mL/min   Anion gap 11 5 - 15  Cortisol     Status: None   Collection Time: 04/08/15 10:52 AM  Result Value Ref Range   Cortisol, Plasma 26.8 ug/dL  Blood gas, arterial     Status: Abnormal   Collection Time: 04/08/15 11:15 AM  Result Value Ref Range   FIO2 0.40    Delivery systems VENTILATOR    VT 360 mL   LHR 26 resp/min   Peep/cpap 5.0 cm H20   pH, Arterial 7.497 (H) 7.350 - 7.450   pCO2  arterial 17.4 (LL) 35.0 - 45.0 mmHg   pO2, Arterial 143 (H) 80.0 - 100.0 mmHg   Bicarbonate 13.3 (L) 20.0 - 24.0 mEq/L   TCO2 13.9 0 - 100 mmol/L   Acid-base deficit 9.4 (H) 0.0 - 2.0 mmol/L   O2 Saturation 99.2 %   Patient temperature 98.6    Collection site A-LINE    Drawn by [id number]    Sample type ARTERIAL DRAW   Iron     Status: None   Collection Time: 04/08/15 11:34 AM  Result Value Ref Range   Iron 63 28 - 170 ug/dL  Glucose, capillary     Status: None   Collection Time: 04/08/15 11:48 AM  Result Value Ref Range   Glucose-Capillary 76 65 - 99 mg/dL  Blood gas, arterial     Status: Abnormal   Collection Time: 04/08/15  1:52 PM  Result Value Ref Range   FIO2 0.30    Delivery systems VENTILATOR    Mode PRESSURE REGULATED VOLUME CONTROL    VT 360 mL   LHR 12 resp/min   Peep/cpap 5.0 cm H20   pH, Arterial 7.434 7.350 - 7.450   pCO2 arterial 36.8 35.0 - 45.0 mmHg   pO2, Arterial 71.1 (L) 80.0 - 100.0 mmHg   Bicarbonate 24.2 (H) 20.0 - 24.0 mEq/L   TCO2 25.4 0 - 100 mmol/L   Acid-Base Excess 0.5 0.0 - 2.0 mmol/L   O2 Saturation 95.0 %   Patient temperature 98.6    Collection site A-LINE    Drawn by [id number]    Sample type ARTERIAL   Hepatic function panel     Status: Abnormal   Collection Time: 04/08/15  3:44 PM  Result Value Ref Range   Total Protein 5.3 (L) 6.5 - 8.1 g/dL   Albumin 3.1 (L) 3.5 - 5.0 g/dL  AST 65 (H) 15 - 41 U/L   ALT 38 14 - 54 U/L   Alkaline Phosphatase 45 38 - 126 U/L   Total Bilirubin 1.2 0.3 - 1.2 mg/dL   Bilirubin, Direct 0.1 0.1 - 0.5 mg/dL   Indirect Bilirubin 1.1 (H) 0.3 - 0.9 mg/dL  Protime-INR     Status: Abnormal   Collection Time: 04/08/15  3:44 PM  Result Value Ref Range   Prothrombin Time 18.1 (H) 11.6 - 15.2 seconds   INR 1.49 0.00 - 1.49  APTT     Status: Abnormal   Collection Time: 04/08/15  3:44 PM  Result Value Ref Range   aPTT 48 (H) 24 - 37 seconds  Fibrinogen     Status: Abnormal   Collection Time: 04/08/15  3:44 PM   Result Value Ref Range   Fibrinogen 175 (L) 204 - 475 mg/dL  CBC     Status: Abnormal   Collection Time: 04/08/15  3:44 PM  Result Value Ref Range   WBC 18.0 (H) 4.0 - 10.5 K/uL   RBC 4.35 3.87 - 5.11 MIL/uL   Hemoglobin 13.0 12.0 - 15.0 g/dL   HCT 16.137.9 09.636.0 - 04.546.0 %   MCV 87.1 78.0 - 100.0 fL   MCH 29.9 26.0 - 34.0 pg   MCHC 34.3 30.0 - 36.0 g/dL   RDW 40.913.2 81.111.5 - 91.415.5 %   Platelets 188 150 - 400 K/uL  Salicylate level     Status: None   Collection Time: 04/08/15  3:44 PM  Result Value Ref Range   Salicylate Lvl <4.0 2.8 - 30.0 mg/dL  Glucose, capillary     Status: None   Collection Time: 04/08/15  3:45 PM  Result Value Ref Range   Glucose-Capillary 95 65 - 99 mg/dL  Blood gas, arterial     Status: Abnormal   Collection Time: 04/08/15  3:52 PM  Result Value Ref Range   FIO2 0.30    Delivery systems VENTILATOR    Mode PRESSURE REGULATED VOLUME CONTROL    VT 360 mL   LHR 12 resp/min   Peep/cpap 5.0 cm H20   pH, Arterial 7.416 7.350 - 7.450   pCO2 arterial 39.1 35.0 - 45.0 mmHg   pO2, Arterial 71.4 (L) 80.0 - 100.0 mmHg   Bicarbonate 24.7 (H) 20.0 - 24.0 mEq/L   TCO2 25.9 0 - 100 mmol/L   Acid-Base Excess 0.7 0.0 - 2.0 mmol/L   O2 Saturation 94.9 %   Patient temperature 98.6    Collection site A-LINE    Drawn by [phone]    Sample type ARTERIAL DRAW   Basic metabolic panel     Status: Abnormal   Collection Time: 04/08/15  4:44 PM  Result Value Ref Range   Sodium 138 135 - 145 mmol/L   Potassium 3.2 (L) 3.5 - 5.1 mmol/L   Chloride 99 (L) 101 - 111 mmol/L   CO2 25 22 - 32 mmol/L   Glucose, Bld 84 65 - 99 mg/dL   BUN <5 (L) 6 - 20 mg/dL   Creatinine, Ser 6.210.83 0.44 - 1.00 mg/dL   Calcium 7.3 (L) 8.9 - 10.3 mg/dL   GFR calc non Af Amer >60 >60 mL/min   GFR calc Af Amer >60 >60 mL/min   Anion gap 14 5 - 15  Salicylate level     Status: None   Collection Time: 04/08/15  8:10 PM  Result Value Ref Range   Salicylate Lvl <4.0 2.8 - 30.0 mg/dL  Hepatic function panel      Status: Abnormal   Collection Time: 04/08/15 10:06 PM  Result Value Ref Range   Total Protein 4.8 (L) 6.5 - 8.1 g/dL   Albumin 2.8 (L) 3.5 - 5.0 g/dL   AST 73 (H) 15 - 41 U/L   ALT 43 14 - 54 U/L   Alkaline Phosphatase 41 38 - 126 U/L   Total Bilirubin 0.9 0.3 - 1.2 mg/dL   Bilirubin, Direct 0.1 0.1 - 0.5 mg/dL   Indirect Bilirubin 0.8 0.3 - 0.9 mg/dL  Protime-INR     Status: Abnormal   Collection Time: 04/08/15 10:06 PM  Result Value Ref Range   Prothrombin Time 18.7 (H) 11.6 - 15.2 seconds   INR 1.56 (H) 0.00 - 1.49  APTT     Status: Abnormal   Collection Time: 04/08/15 10:06 PM  Result Value Ref Range   aPTT 40 (H) 24 - 37 seconds  Fibrinogen     Status: None   Collection Time: 04/08/15 10:06 PM  Result Value Ref Range   Fibrinogen 211 204 - 475 mg/dL  CBC     Status: Abnormal   Collection Time: 04/08/15 10:06 PM  Result Value Ref Range   WBC 12.6 (H) 4.0 - 10.5 K/uL   RBC 3.89 3.87 - 5.11 MIL/uL   Hemoglobin 11.7 (L) 12.0 - 15.0 g/dL   HCT 40.9 (L) 81.1 - 91.4 %   MCV 88.4 78.0 - 100.0 fL   MCH 30.1 26.0 - 34.0 pg   MCHC 34.0 30.0 - 36.0 g/dL   RDW 78.2 95.6 - 21.3 %   Platelets 149 (L) 150 - 400 K/uL  Basic metabolic panel     Status: Abnormal   Collection Time: 04/09/15  1:32 AM  Result Value Ref Range   Sodium 140 135 - 145 mmol/L   Potassium 3.1 (L) 3.5 - 5.1 mmol/L   Chloride 99 (L) 101 - 111 mmol/L   CO2 30 22 - 32 mmol/L   Glucose, Bld 78 65 - 99 mg/dL   BUN <5 (L) 6 - 20 mg/dL   Creatinine, Ser 0.86 0.44 - 1.00 mg/dL   Calcium 7.6 (L) 8.9 - 10.3 mg/dL   GFR calc non Af Amer >60 >60 mL/min   GFR calc Af Amer >60 >60 mL/min   Anion gap 11 5 - 15  Blood gas, arterial     Status: Abnormal   Collection Time: 04/09/15  3:45 AM  Result Value Ref Range   FIO2 0.30    Delivery systems VENTILATOR    Mode PRESSURE REGULATED VOLUME CONTROL    VT 360 mL   LHR 12 resp/min   Peep/cpap 5.0 cm H20   pH, Arterial 7.398 7.350 - 7.450   pCO2 arterial 43.5 35.0 -  45.0 mmHg   pO2, Arterial 76.2 (L) 80.0 - 100.0 mmHg   Bicarbonate 26.2 (H) 20.0 - 24.0 mEq/L   TCO2 27.6 0 - 100 mmol/L   Acid-Base Excess 1.9 0.0 - 2.0 mmol/L   O2 Saturation 95.0 %   Patient temperature 98.6    Collection site A-LINE    Drawn by [phone]    Sample type ARTERIAL   Hepatic function panel     Status: Abnormal   Collection Time: 04/09/15  6:04 AM  Result Value Ref Range   Total Protein 4.8 (L) 6.5 - 8.1 g/dL   Albumin 2.7 (L) 3.5 - 5.0 g/dL   AST 75 (H) 15 - 41 U/L  ALT 46 14 - 54 U/L   Alkaline Phosphatase 44 38 - 126 U/L   Total Bilirubin 1.1 0.3 - 1.2 mg/dL   Bilirubin, Direct <1.6 (L) 0.1 - 0.5 mg/dL   Indirect Bilirubin NOT CALCULATED 0.3 - 0.9 mg/dL  Protime-INR     Status: Abnormal   Collection Time: 04/09/15  6:04 AM  Result Value Ref Range   Prothrombin Time 18.4 (H) 11.6 - 15.2 seconds   INR 1.52 (H) 0.00 - 1.49  APTT     Status: None   Collection Time: 04/09/15  6:04 AM  Result Value Ref Range   aPTT 36 24 - 37 seconds  Fibrinogen     Status: None   Collection Time: 04/09/15  6:04 AM  Result Value Ref Range   Fibrinogen 283 204 - 475 mg/dL  CBC     Status: Abnormal   Collection Time: 04/09/15  6:04 AM  Result Value Ref Range   WBC 10.6 (H) 4.0 - 10.5 K/uL   RBC 3.75 (L) 3.87 - 5.11 MIL/uL   Hemoglobin 11.1 (L) 12.0 - 15.0 g/dL   HCT 10.9 (L) 60.4 - 54.0 %   MCV 89.6 78.0 - 100.0 fL   MCH 29.6 26.0 - 34.0 pg   MCHC 33.0 30.0 - 36.0 g/dL   RDW 98.1 19.1 - 47.8 %   Platelets 135 (L) 150 - 400 K/uL  Triglycerides     Status: None   Collection Time: 04/09/15  6:04 AM  Result Value Ref Range   Triglycerides 145 <150 mg/dL  Acetaminophen level     Status: Abnormal   Collection Time: 04/09/15  6:05 AM  Result Value Ref Range   Acetaminophen (Tylenol), Serum <10 (L) 10 - 30 ug/mL    Intake/Output Summary (Last 24 hours) at 04/09/15 0843 Last data filed at 04/09/15 0640  Gross per 24 hour  Intake 8139.88 ml  Output   3325 ml  Net 4814.88 ml     A/P: 1. Acetaminophen OD -Improved acetaminophen level (<10). Now within normal limits.  -trend LFTS. Currently stable  2. Salicylate toxicity -now s/p emergent HD for salicylate toxicity -HD yesterday was 4 hrs with improved mental status -level yesterday was <4.0 (normal range) -Do not believe that she needs HD anymore; monitor urine output  3. Metabolic acidosis - Resolved -AG closed; CO2 30  Will likely sign-off. Pending attending approval.   Caryl Ada, DO 04/09/2015, 8:43 AM PGY-2, [facility]

## 2015-04-09 NOTE — Procedures (Signed)
Extubation Procedure Note  Patient Details:   Name: Amber Frey DOB: 10/09/1995 MRN: [id number]   Airway Documentation:   Positive cuff leak, on 3L [place], vital signs stable throughout, nurse at bedside  Evaluation  O2 sats: stable throughout Complications: No apparent complications Patient did tolerate procedure well. Bilateral Breath Sounds: Clear Suctioning: Oral, Airway Yes    [place] R Shaquoya Cosper 04/09/2015, 9:48 AM

## 2015-04-09 NOTE — Progress Notes (Signed)
PULMONARY / CRITICAL CARE MEDICINE   Name: Amber Frey MRN: [id number] DOB: 1996-03-12    ADMISSION DATE:  04/07/2015 CONSULTATION DATE:  04/07/15  REFERRING MD :  EDP  CHIEF COMPLAINT:  AMS  INITIAL PRESENTATION:  19 y.o. F brought to [facility] ED 10/31 after intentional overdose of tylenol and aspirin as part of suicide attempt.  In ED, she was intubated for airway protection and started on NAC.   STUDIES:  CXR 10/31 >>> normal. CXR 11/2>>  SIGNIFICANT EVENTS: 10/31 - admitted after intentional overdose of tylenol and aspirin as part of suicide attempt. 11/1- refractory shock, hd emergent, aline, new line placed, levophed  SUBJECTIVE:  Off pressors, much more awake this morning.  She is pleasant on exam.  VITAL SIGNS: Temp:  [98.3 F (36.8 C)-103.1 F (39.5 C)] 98.5 F (36.9 C) (11/02 0418) Pulse Rate:  [86-176] 88 (11/02 0711) Resp:  [10-36] 15 (11/02 0711) BP: (64-133)/(30-92) 100/51 mmHg (11/02 0711) SpO2:  [88 %-100 %] 99 % (11/02 0711) Arterial Line BP: (92-162)/(43-76) 99/51 mmHg (11/02 0600) FiO2 (%):  [30 %-100 %] 30 % (11/02 0711) Weight:  [108 lb (48.988 kg)] 108 lb (48.988 kg) (11/02 0500) HEMODYNAMICS: CVP:  [4 mmHg-68 mmHg] 5 mmHg VENTILATOR SETTINGS: Vent Mode:  [-] PRVC FiO2 (%):  [30 %-100 %] 30 % Set Rate:  [12 bmp-26 bmp] 12 bmp Vt Set:  [360 mL] 360 mL PEEP:  [5 cmH20] 5 cmH20 Plateau Pressure:  [11 cmH20-19 cmH20] 14 cmH20 INTAKE / OUTPUT: Intake/Output      11/01 0701 - 11/02 0700 11/02 0701 - 11/03 0700   I.V. (mL/kg) 7626.4 (155.7)    IV Piggyback 652    Total Intake(mL/kg) 8278.4 (169)    Urine (mL/kg/hr) 3125 (2.7)    Emesis/NG output 200 (0.2)    Total Output 3325     Net +4953.4            PHYSICAL EXAMINATION: General: Young asian female, resting comfortably in bed, NAD Neuro: wakes up to light touch, nods to questions, notepad in lap that she is using to communicate HEENT: no trauma, ETT Cardiovascular: RRR, no M/R/G  Lungs:  Good  air movement Abdomen: Flat, soft, NT/ND, BS x 4 Musculoskeletal: Warm, well perfused, No gross deformities, no edema.  Skin: Intact, warm, no rashes.  LABS:  CBC  Recent Labs Lab 04/08/15 1544 04/08/15 2206 04/09/15 0604  WBC 18.0* 12.6* 10.6*  HGB 13.0 11.7* 11.1*  HCT 37.9 34.4* 33.6*  PLT 188 149* 135*   Coag's  Recent Labs Lab 04/08/15 1544 04/08/15 2206 04/09/15 0604  APTT 48* 40* 36  INR 1.49 1.56* 1.52*   BMET  Recent Labs Lab 04/08/15 1048 04/08/15 1644 04/09/15 0132  NA 138 138 140  K 3.6 3.2* 3.1*  CL 101 99* 99*  CO2 _0 BUN <5* <5* <5*  CREATININE 0.53 0.83 0.85  GLUCOSE 90 84 78   Electrolytes  Recent Labs Lab 04/07/15 2201 04/08/15 0658 04/08/15 1048 04/08/15 1644 04/09/15 0132  CALCIUM 9.2 7.6* 7.2* 7.3* 7.6*  MG 2.3 1.8  --   --   --   PHOS  --  1.7*  --   --   --    Sepsis Markers  Recent Labs Lab 04/08/15 0915 04/08/15 0919 04/08/15 1045  LATICACIDVEN 1.88 1.96 1.5   ABG  Recent Labs Lab 04/08/15 1352 04/08/15 1552 04/09/15 0345  PHART 7.434 7.416 7.398  PCO2ART 36.8 39.1 43.5  PO2ART 71.1* 71.4* 76.2*  Liver Enzymes  Recent Labs Lab 04/08/15 1544 04/08/15 2206 04/09/15 0604  AST 65* 73* 75*  ALT 38 43 46  ALKPHOS 45 41 44  BILITOT 1.2 0.9 1.1  ALBUMIN 3.1* 2.8* 2.7*   Cardiac Enzymes  Recent Labs Lab 04/08/15 0658  TROPONINI 0.03   Glucose  Recent Labs Lab 04/08/15 0313 04/08/15 0727 04/08/15 1148 04/08/15 1545  GLUCAP 128* 129* 76 95    Imaging Dg Chest Port 1 View  04/08/2015  CLINICAL DATA:  Central line placement. EXAM: PORTABLE CHEST 1 VIEW COMPARISON:  04/08/2015 FINDINGS: Left internal jugular central line has been placed with the tip at the cavoatrial junction. No pneumothorax. Right central line, endotracheal tube and NG tube are unchanged. Lungs are clear. Heart is normal size. No effusions. IMPRESSION: Left central line placement. Tip is at the cavoatrial junction. No  pneumothorax. No active disease. Electronically Signed   By: Kevin  Dover M.D.   On: 04/08/2015 09:49    ASSESSMENT / PLAN:  GASTROINTESTINAL A:   Tylenol overdose ? Hematemesis, r/o GI bleed, not impressive Nutrition. P:   NAC per pharmacy, DO NOT dc this, follow LFT, see below Repeat acetaminophen <10 Repeat LFTs in am (AST still elevated 75), will dc once normalized, for q12h  Salicylate <4 x2 Monitor OGT for further signs of bleeding. Pantoprazole gtt dc change to bid Consider NGT feeds today if not extubated  RENAL A:   Hypokalemia AG + NAG metabolic acidosis - due to ASA + tylenol overdose + lactate + vomiting. AKI. P:   HCO3 dc Osmole gap: 35.5, EG, meth neg Ethylene glycol, methanol negative and Low suspcion BMP in am Get CVP qshift, noted 6 Recheck Mg, Phos Dc line neck asap  PULMONARY OETT 11/1 >>> A: VDRF due to inability to protect airway following intentional overdose as part of suicide attempt. Respiratory alkalosis with metabolic acidosis - due to ASA ingestion / overdose. Now all uncompensated met acidosis P:   Partial vent support Consider SBT this am, cpap 5 ps 5, mental status supports extubation Can tolerate furthe rpos balance  CARDIOVASCULAR HD cath pending 11/1 >>> A:  Prolonged QTc - in setting hypokalemia, resolved. QTc 430 Refractory shock from OD, hypovolemia P:  Levophed off, MAp goals met Central and a-line in place, dc today i hope Cortisol 26.8 Mg and phos level in am Fluids  HEMATOLOGIC A:   Hemoconcentration. VTE Prophylaxis. P:  SCD's only. Repeat CBC in AM. Fluids Repeat coags, in setting tylenol likely to rise  INFECTIOUS A:   Concern for aspiration, afebrile x 24 hours P:   Sputum Cx 11/1 >>> Abx:  Zosyn, start date 11/1>>>11/1 Unasyn 11/1>>> PCT assessment, likely we can dc abx  ENDOCRINE A:   No acute issues.   P:   Monitor glucose on BMP, maintain > 100 Start NGT feeds if not  extubated  NEUROLOGIC A:   Acute metabolic encephalopathy. Intentional overdose as part of suicide attempt. At risk ICP from tylenol P:   Sedation:  Propofol could re add if needed Fentanyl gtt NO versed in setting liver risk  RASS goal: -1 Daily WUA  Suicide precautions. Will need psych consult once extubated.   Family updated: no family at bedside  Interdisciplinary Family Meeting v Palliative Care Meeting:  Due by: 11/7.  Ashly M. Gottschalk, DO PGY-2, [facility]  STAFF NOTE: I, Daniel Feinstein, MD FACP have personally reviewed patient's available data, including medical history, events of note, physical examination and test results   as part of my evaluation. I have discussed with resident/NP and other care providers such as pharmacist, RN and RRT. In addition, I personally evaluated patient and elicited key findings of: more awake this am ,lungs clear, wean cpap5 ps5, goal extubation, may need T fstarted if not extubated, dc bicarb, EG, meth neg, dc line neck asap and fem  Line, NO role fomepizol, improved, Mucomysts until LFt completely resolved The patient is critically ill with multiple organ systems failure and requires high complexity decision making for assessment and support, frequent evaluation and titration of therapies, application of advanced monitoring technologies and extensive interpretation of multiple databases.   Critical Care Time devoted to patient care services described in this note is35 Minutes. This time reflects time of care of this signee: Merrie Roof, MD FACP. This critical care time does not reflect procedure time, or teaching time or supervisory time of PA/NP/Med student/Med Resident etc but could involve care discussion time. Rest per NP/medical resident whose note is outlined above and that I agree with   Lavon Paganini. Titus Mould, MD, [facility] Pgr: [facility] 04/09/2015 9:14 AM

## 2015-04-10 DIAGNOSIS — R45851 Suicidal ideations: Secondary | ICD-10-CM

## 2015-04-10 DIAGNOSIS — T391X1S Poisoning by 4-Aminophenol derivatives, accidental (unintentional), sequela: Secondary | ICD-10-CM

## 2015-04-10 LAB — HEPATIC FUNCTION PANEL
ALBUMIN: 2.6 g/dL — AB (ref 3.5–5.0)
ALT: 45 U/L (ref 14–54)
AST: 61 U/L — ABNORMAL HIGH (ref 15–41)
Alkaline Phosphatase: 41 U/L (ref 38–126)
BILIRUBIN TOTAL: 0.8 mg/dL (ref 0.3–1.2)
TOTAL PROTEIN: 4.7 g/dL — AB (ref 6.5–8.1)

## 2015-04-10 LAB — PHOSPHORUS: PHOSPHORUS: 2.2 mg/dL — AB (ref 2.5–4.6)

## 2015-04-10 LAB — BASIC METABOLIC PANEL
ANION GAP: 9 (ref 5–15)
ANION GAP: 11 (ref 5–15)
Anion gap: 8 (ref 5–15)
BUN: <5 mg/dL — ABNORMAL LOW (ref 6–20)
CALCIUM: 8.2 mg/dL — AB (ref 8.9–10.3)
CHLORIDE: 104 mmol/L (ref 101–111)
CHLORIDE: 105 mmol/L (ref 101–111)
CO2: 29 mmol/L (ref 22–32)
CO2: 29 mmol/L (ref 22–32)
CO2: 27 mmol/L (ref 22–32)
CREATININE: 0.69 mg/dL (ref 0.44–1.00)
Calcium: 8.3 mg/dL — ABNORMAL LOW (ref 8.9–10.3)
Calcium: 8 mg/dL — ABNORMAL LOW (ref 8.9–10.3)
Chloride: 102 mmol/L (ref 101–111)
Creatinine, Ser: 0.68 mg/dL (ref 0.44–1.00)
Creatinine, Ser: 0.59 mg/dL (ref 0.44–1.00)
GFR calc Af Amer: >60 mL/min (ref >60)
GFR calc non Af Amer: >60 mL/min (ref >60)
GFR calc non Af Amer: >60 mL/min (ref >60)
GLUCOSE: 90 mg/dL (ref 65–99)
GLUCOSE: 95 mg/dL (ref 65–99)
Glucose, Bld: 87 mg/dL (ref 65–99)
POTASSIUM: 3.4 mmol/L — AB (ref 3.5–5.1)
POTASSIUM: 3.2 mmol/L — AB (ref 3.5–5.1)
Potassium: 3.5 mmol/L (ref 3.5–5.1)
SODIUM: 141 mmol/L (ref 135–145)
Sodium: 140 mmol/L (ref 135–145)
Sodium: 143 mmol/L (ref 135–145)

## 2015-04-10 LAB — CBC
HCT: 31.5 % — ABNORMAL LOW (ref 36.0–46.0)
HEMATOCRIT: 31.5 % — AB (ref 36.0–46.0)
HEMOGLOBIN: 10.2 g/dL — AB (ref 12.0–15.0)
HEMOGLOBIN: 10.5 g/dL — AB (ref 12.0–15.0)
MCH: 29 pg (ref 26.0–34.0)
MCH: 29.9 pg (ref 26.0–34.0)
MCHC: 32.4 g/dL (ref 30.0–36.0)
MCHC: 33.3 g/dL (ref 30.0–36.0)
MCV: 89.5 fL (ref 78.0–100.0)
MCV: 89.7 fL (ref 78.0–100.0)
PLATELETS: 126 10*3/uL — AB (ref 150–400)
Platelets: 123 10*3/uL — ABNORMAL LOW (ref 150–400)
RBC: 3.51 MIL/uL — ABNORMAL LOW (ref 3.87–5.11)
RBC: 3.52 MIL/uL — ABNORMAL LOW (ref 3.87–5.11)
RDW: 13.4 % (ref 11.5–15.5)
RDW: 13.6 % (ref 11.5–15.5)
WBC: 8.2 10*3/uL (ref 4.0–10.5)
WBC: 8.7 10*3/uL (ref 4.0–10.5)

## 2015-04-10 LAB — APTT
APTT: 36 s (ref 24–37)
APTT: 37 s (ref 24–37)
APTT: 47 s — AB (ref 24–37)
aPTT: 34 seconds (ref 24–37)

## 2015-04-10 LAB — PROTIME-INR
INR: 1.16 (ref 0.00–1.49)
INR: 1.36 (ref 0.00–1.49)
INR: 1.45 (ref 0.00–1.49)
INR: 1.91 — ABNORMAL HIGH (ref 0.00–1.49)
PROTHROMBIN TIME: 21.8 s — AB (ref 11.6–15.2)
PROTHROMBIN TIME: 15 s (ref 11.6–15.2)
Prothrombin Time: 16.9 seconds — ABNORMAL HIGH (ref 11.6–15.2)
Prothrombin Time: 17.8 seconds — ABNORMAL HIGH (ref 11.6–15.2)

## 2015-04-10 LAB — FIBRINOGEN
FIBRINOGEN: 371 mg/dL (ref 204–475)
FIBRINOGEN: 380 mg/dL (ref 204–475)

## 2015-04-10 LAB — CULTURE, RESPIRATORY: CULTURE: NO GROWTH

## 2015-04-10 LAB — PROCALCITONIN: Procalcitonin: 1.62 ng/mL

## 2015-04-10 LAB — CULTURE, RESPIRATORY W GRAM STAIN

## 2015-04-10 LAB — MAGNESIUM: Magnesium: 1.6 mg/dL — ABNORMAL LOW (ref 1.7–2.4)

## 2015-04-10 LAB — TRIGLYCERIDES: Triglycerides: 73 mg/dL (ref <150)

## 2015-04-10 MED ORDER — CETYLPYRIDINIUM CHLORIDE 0.05 % MT LIQD
7.0000 mL | Freq: Two times a day (BID) | OROMUCOSAL | Status: DC
  Administered 2015-04-10 – 2015-04-11 (×2): 7 mL via OROMUCOSAL

## 2015-04-10 MED ORDER — MAGNESIUM SULFATE 4 GM/100ML IV SOLN
4.0000 g | Freq: Once | INTRAVENOUS | Status: AC
  Administered 2015-04-10: 4 g via INTRAVENOUS
  Filled 2015-04-10 (×2): qty 100

## 2015-04-10 MED ORDER — POTASSIUM CHLORIDE CRYS ER 20 MEQ PO TBCR
40.0000 meq | EXTENDED_RELEASE_TABLET | Freq: Two times a day (BID) | ORAL | Status: DC
  Administered 2015-04-10 – 2015-04-11 (×3): 40 meq via ORAL
  Filled 2015-04-10 (×3): qty 2

## 2015-04-10 NOTE — Consult Note (Signed)
[facility] Consult   Reason for Consult:  Intentional overdose as a suicide attempt Referring Physician:  Dr. Ashok Cordia Patient Identification: Amber Frey MRN:  [id number] Principal Diagnosis: Overdose on Tylenol Diagnosis:   Patient Active Problem List   Diagnosis Date Noted  . Overdose on Tylenol [T39.1X4A] 04/08/2015  . Acetaminophen overdose [T39.1X4A]   . Metabolic acidosis [X54.0]   . Salicylate overdose [G86.761P]   . Suicide attempt ([place]) [T14.91]   . AKI (acute kidney injury) ([place]) [N17.9]   . Acute respiratory failure ([place]) [J96.00]   . Hypokalemia [E87.6]   . Altered mental status [R41.82]   . Altered level of consciousness [R40.4]   . UGI bleed [K92.2]   . Encounter for central line placement [Z45.2]     Total Time spent with patient: 1 hour  Subjective:   Amber Frey is a 18 y.o. female patient admitted with Intentional overdose as a suicide attempt.  HPI:  Amber Frey is a 19 year old female, college student admitted to [facility] medical ICU as a status post intentional overdose as an reported suicide attempt.Patient stated being depressed due to stresses from work, school and financial problems. She reportedly wrote suicide note and took a lot of aspirin, tylenol and sudafed overdose with intention to end her life. She was found by family unresponsive and empty bottles near her. She has Acetaminophen was 509 and salicylate level TOI71.2 on arrival.  Patient did have hematemesis in the emergency department with altered mental status and was intubated. She was extubated yesterday morning and able to cooperate with my evaluation. Patient family is at bed side. She received bicarbonate drip and N acetyl-cysteine and hemodialysis. There was a negative urine drug screen.  Past Psychiatric History: Reported no history of mental illness.  Risk to Self: Is patient at risk for suicide?: No Risk to Others:   Prior Inpatient Therapy:   Prior Outpatient Therapy:     Past Medical History: History reviewed. No pertinent past medical history. History reviewed. No pertinent past surgical history. Family History: History reviewed. No pertinent family history. Family Psychiatric  History: denied Social History:  History  Alcohol Use No     History  Drug Use No    Social History   Social History  . Marital Status: Single    Spouse Name: N/A  . Number of Children: N/A  . Years of Education: N/A   Social History Main Topics  . Smoking status: Never Smoker   . Smokeless tobacco: Never Used  . Alcohol Use: No  . Drug Use: No  . Sexual Activity: No   Other Topics Concern  . None   Social History Narrative   Additional Social History:                          Allergies:  No Known Allergies  Labs:  Results for orders placed or performed during the hospital encounter of 04/07/15 (from the past 48 hour(s))  Salicylate level     Status: None   Collection Time: 04/08/15  9:18 AM  Result Value Ref Range   Salicylate Lvl 6.9 2.8 - 30.0 mg/dL  CG4 I-STAT (Lactic acid)     Status: None   Collection Time: 04/08/15  9:19 AM  Result Value Ref Range   Lactic Acid, Venous 1.96 0.5 - 2.0 mmol/L  I-STAT 3, arterial blood gas (G3+)     Status: Abnormal   Collection Time: 04/08/15  9:26 AM  Result Value Ref  Range   pH, Arterial 7.468 (H) 7.350 - 7.450   pCO2 arterial 34.2 (L) 35.0 - 45.0 mmHg   pO2, Arterial 164.0 (H) 80.0 - 100.0 mmHg   Bicarbonate 24.8 (H) 20.0 - 24.0 mEq/L   TCO2 26 0 - 100 mmol/L   O2 Saturation 100.0 %   Acid-Base Excess 1.0 0.0 - 2.0 mmol/L   Patient temperature 98.6 F    Collection site FEMORAL ARTERY    Drawn by VP    Sample type ARTERIAL   Culture, blood (routine x 2)     Status: None (Preliminary result)   Collection Time: 04/08/15 10:20 AM  Result Value Ref Range   Specimen Description BLOOD CENTRAL LINE    Special Requests BOTTLES DRAWN AEROBIC AND ANAEROBIC 10CC    Culture NO GROWTH 1 DAY    Report  Status PENDING   Culture, blood (routine x 2)     Status: None (Preliminary result)   Collection Time: 04/08/15 10:24 AM  Result Value Ref Range   Specimen Description BLOOD LEFT HAND    Special Requests IN PEDIATRIC BOTTLE  2CC    Culture NO GROWTH 1 DAY    Report Status PENDING   Hepatic function panel     Status: Abnormal   Collection Time: 04/08/15 10:41 AM  Result Value Ref Range   Total Protein 5.3 (L) 6.5 - 8.1 g/dL   Albumin 3.2 (L) 3.5 - 5.0 g/dL   AST 50 (H) 15 - 41 U/L   ALT 27 14 - 54 U/L   Alkaline Phosphatase 47 38 - 126 U/L   Total Bilirubin 0.7 0.3 - 1.2 mg/dL   Bilirubin, Direct 0.1 0.1 - 0.5 mg/dL   Indirect Bilirubin 0.6 0.3 - 0.9 mg/dL  Protime-INR     Status: Abnormal   Collection Time: 04/08/15 10:41 AM  Result Value Ref Range   Prothrombin Time 18.1 (H) 11.6 - 15.2 seconds   INR 1.50 (H) 0.00 - 1.49  APTT     Status: None   Collection Time: 04/08/15 10:41 AM  Result Value Ref Range   aPTT 36 24 - 37 seconds  Fibrinogen     Status: Abnormal   Collection Time: 04/08/15 10:41 AM  Result Value Ref Range   Fibrinogen 176 (L) 204 - 475 mg/dL  CBC     Status: Abnormal   Collection Time: 04/08/15 10:41 AM  Result Value Ref Range   WBC 21.7 (H) 4.0 - 10.5 K/uL   RBC 4.36 3.87 - 5.11 MIL/uL   Hemoglobin 12.9 12.0 - 15.0 g/dL   HCT 38.6 36.0 - 46.0 %   MCV 88.5 78.0 - 100.0 fL   MCH 29.6 26.0 - 34.0 pg   MCHC 33.4 30.0 - 36.0 g/dL   RDW 13.3 11.5 - 15.5 %   Platelets 234 150 - 400 K/uL  Lactic acid, plasma     Status: None   Collection Time: 04/08/15 10:45 AM  Result Value Ref Range   Lactic Acid, Venous 1.5 0.5 - 2.0 mmol/L  Basic metabolic panel     Status: Abnormal   Collection Time: 04/08/15 10:48 AM  Result Value Ref Range   Sodium 138 135 - 145 mmol/L   Potassium 3.6 3.5 - 5.1 mmol/L   Chloride 101 101 - 111 mmol/L   CO2 26 22 - 32 mmol/L   Glucose, Bld 90 65 - 99 mg/dL   BUN <5 (L) 6 - 20 mg/dL   Creatinine, Ser 0.53 0.44 - 1.00  mg/dL    Calcium 7.2 (L) 8.9 - 10.3 mg/dL   GFR calc non Af Amer >60 >60 mL/min   GFR calc Af Amer >60 >60 mL/min    Comment: (NOTE) The eGFR has been calculated using the CKD EPI equation. This calculation has not been validated in all clinical situations. eGFR's persistently <60 mL/min signify possible Chronic Kidney Disease.    Anion gap 11 5 - 15  Cortisol     Status: None   Collection Time: 04/08/15 10:52 AM  Result Value Ref Range   Cortisol, Plasma 26.8 ug/dL    Comment: (NOTE) AM    6.7 - 22.6 ug/dL PM   <10.0       ug/dL   Blood gas, arterial     Status: Abnormal   Collection Time: 04/08/15 11:15 AM  Result Value Ref Range   FIO2 0.40    Delivery systems VENTILATOR    VT 360 mL   LHR 26 resp/min   Peep/cpap 5.0 cm H20   pH, Arterial 7.497 (H) 7.350 - 7.450   pCO2 arterial 17.4 (LL) 35.0 - 45.0 mmHg    Comment: CRITICAL RESULT CALLED TO, READ BACK BY AND VERIFIED WITH: [place] GROVES,RRT RCPAT 1122,BY MEGAN JESTERRRT RCP ON 04/08/15    pO2, Arterial 143 (H) 80.0 - 100.0 mmHg   Bicarbonate 13.3 (L) 20.0 - 24.0 mEq/L   TCO2 13.9 0 - 100 mmol/L   Acid-base deficit 9.4 (H) 0.0 - 2.0 mmol/L   O2 Saturation 99.2 %   Patient temperature 98.6    Collection site A-LINE    Drawn by [id number]    Sample type ARTERIAL DRAW   Iron     Status: None   Collection Time: 04/08/15 11:34 AM  Result Value Ref Range   Iron 63 28 - 170 ug/dL  Glucose, capillary     Status: None   Collection Time: 04/08/15 11:48 AM  Result Value Ref Range   Glucose-Capillary 76 65 - 99 mg/dL  Blood gas, arterial     Status: Abnormal   Collection Time: 04/08/15  1:52 PM  Result Value Ref Range   FIO2 0.30    Delivery systems VENTILATOR    Mode PRESSURE REGULATED VOLUME CONTROL    VT 360 mL   LHR 12 resp/min   Peep/cpap 5.0 cm H20   pH, Arterial 7.434 7.350 - 7.450   pCO2 arterial 36.8 35.0 - 45.0 mmHg   pO2, Arterial 71.1 (L) 80.0 - 100.0 mmHg   Bicarbonate 24.2 (H) 20.0 - 24.0 mEq/L   TCO2 25.4 0 - 100  mmol/L   Acid-Base Excess 0.5 0.0 - 2.0 mmol/L   O2 Saturation 95.0 %   Patient temperature 98.6    Collection site A-LINE    Drawn by [id number]    Sample type ARTERIAL   Hepatic function panel     Status: Abnormal   Collection Time: 04/08/15  3:44 PM  Result Value Ref Range   Total Protein 5.3 (L) 6.5 - 8.1 g/dL   Albumin 3.1 (L) 3.5 - 5.0 g/dL   AST 65 (H) 15 - 41 U/L   ALT 38 14 - 54 U/L   Alkaline Phosphatase 45 38 - 126 U/L   Total Bilirubin 1.2 0.3 - 1.2 mg/dL   Bilirubin, Direct 0.1 0.1 - 0.5 mg/dL   Indirect Bilirubin 1.1 (H) 0.3 - 0.9 mg/dL  Protime-INR     Status: Abnormal   Collection Time: 04/08/15  3:44 PM  Result Value Ref Range  Prothrombin Time 18.1 (H) 11.6 - 15.2 seconds   INR 1.49 0.00 - 1.49  APTT     Status: Abnormal   Collection Time: 04/08/15  3:44 PM  Result Value Ref Range   aPTT 48 (H) 24 - 37 seconds    Comment:        IF BASELINE aPTT IS ELEVATED, SUGGEST PATIENT RISK ASSESSMENT BE USED TO DETERMINE APPROPRIATE ANTICOAGULANT THERAPY.   Fibrinogen     Status: Abnormal   Collection Time: 04/08/15  3:44 PM  Result Value Ref Range   Fibrinogen 175 (L) 204 - 475 mg/dL  CBC     Status: Abnormal   Collection Time: 04/08/15  3:44 PM  Result Value Ref Range   WBC 18.0 (H) 4.0 - 10.5 K/uL   RBC 4.35 3.87 - 5.11 MIL/uL   Hemoglobin 13.0 12.0 - 15.0 g/dL   HCT 37.9 36.0 - 46.0 %   MCV 87.1 78.0 - 100.0 fL   MCH 29.9 26.0 - 34.0 pg   MCHC 34.3 30.0 - 36.0 g/dL   RDW 13.2 11.5 - 15.5 %   Platelets 188 371 - 062 K/uL  Salicylate level     Status: None   Collection Time: 04/08/15  3:44 PM  Result Value Ref Range   Salicylate Lvl <6.9 2.8 - 30.0 mg/dL  Glucose, capillary     Status: None   Collection Time: 04/08/15  3:45 PM  Result Value Ref Range   Glucose-Capillary 95 65 - 99 mg/dL  Blood gas, arterial     Status: Abnormal   Collection Time: 04/08/15  3:52 PM  Result Value Ref Range   FIO2 0.30    Delivery systems VENTILATOR    Mode PRESSURE  REGULATED VOLUME CONTROL    VT 360 mL   LHR 12 resp/min   Peep/cpap 5.0 cm H20   pH, Arterial 7.416 7.350 - 7.450   pCO2 arterial 39.1 35.0 - 45.0 mmHg   pO2, Arterial 71.4 (L) 80.0 - 100.0 mmHg   Bicarbonate 24.7 (H) 20.0 - 24.0 mEq/L   TCO2 25.9 0 - 100 mmol/L   Acid-Base Excess 0.7 0.0 - 2.0 mmol/L   O2 Saturation 94.9 %   Patient temperature 98.6    Collection site A-LINE    Drawn by [phone]    Sample type ARTERIAL DRAW   Basic metabolic panel     Status: Abnormal   Collection Time: 04/08/15  4:44 PM  Result Value Ref Range   Sodium 138 135 - 145 mmol/L   Potassium 3.2 (L) 3.5 - 5.1 mmol/L   Chloride 99 (L) 101 - 111 mmol/L   CO2 25 22 - 32 mmol/L   Glucose, Bld 84 65 - 99 mg/dL   BUN <5 (L) 6 - 20 mg/dL   Creatinine, Ser 0.83 0.44 - 1.00 mg/dL   Calcium 7.3 (L) 8.9 - 10.3 mg/dL   GFR calc non Af Amer >60 >60 mL/min   GFR calc Af Amer >60 >60 mL/min    Comment: (NOTE) The eGFR has been calculated using the CKD EPI equation. This calculation has not been validated in all clinical situations. eGFR's persistently <60 mL/min signify possible Chronic Kidney Disease.    Anion gap 14 5 - 15  Salicylate level     Status: None   Collection Time: 04/08/15  8:10 PM  Result Value Ref Range   Salicylate Lvl <2.7 2.8 - 30.0 mg/dL  Hepatic function panel     Status: Abnormal   Collection  Time: 04/08/15 10:06 PM  Result Value Ref Range   Total Protein 4.8 (L) 6.5 - 8.1 g/dL   Albumin 2.8 (L) 3.5 - 5.0 g/dL   AST 73 (H) 15 - 41 U/L   ALT 43 14 - 54 U/L   Alkaline Phosphatase 41 38 - 126 U/L   Total Bilirubin 0.9 0.3 - 1.2 mg/dL   Bilirubin, Direct 0.1 0.1 - 0.5 mg/dL   Indirect Bilirubin 0.8 0.3 - 0.9 mg/dL  Protime-INR     Status: Abnormal   Collection Time: 04/08/15 10:06 PM  Result Value Ref Range   Prothrombin Time 18.7 (H) 11.6 - 15.2 seconds   INR 1.56 (H) 0.00 - 1.49  APTT     Status: Abnormal   Collection Time: 04/08/15 10:06 PM  Result Value Ref Range   aPTT 40 (H)  24 - 37 seconds    Comment:        IF BASELINE aPTT IS ELEVATED, SUGGEST PATIENT RISK ASSESSMENT BE USED TO DETERMINE APPROPRIATE ANTICOAGULANT THERAPY.   Fibrinogen     Status: None   Collection Time: 04/08/15 10:06 PM  Result Value Ref Range   Fibrinogen 211 204 - 475 mg/dL  CBC     Status: Abnormal   Collection Time: 04/08/15 10:06 PM  Result Value Ref Range   WBC 12.6 (H) 4.0 - 10.5 K/uL   RBC 3.89 3.87 - 5.11 MIL/uL   Hemoglobin 11.7 (L) 12.0 - 15.0 g/dL   HCT 34.4 (L) 36.0 - 46.0 %   MCV 88.4 78.0 - 100.0 fL   MCH 30.1 26.0 - 34.0 pg   MCHC 34.0 30.0 - 36.0 g/dL   RDW 13.4 11.5 - 15.5 %   Platelets 149 (L) 150 - 400 K/uL  Basic metabolic panel     Status: Abnormal   Collection Time: 04/09/15  1:32 AM  Result Value Ref Range   Sodium 140 135 - 145 mmol/L   Potassium 3.1 (L) 3.5 - 5.1 mmol/L   Chloride 99 (L) 101 - 111 mmol/L   CO2 30 22 - 32 mmol/L   Glucose, Bld 78 65 - 99 mg/dL   BUN <5 (L) 6 - 20 mg/dL   Creatinine, Ser 0.85 0.44 - 1.00 mg/dL   Calcium 7.6 (L) 8.9 - 10.3 mg/dL   GFR calc non Af Amer >60 >60 mL/min   GFR calc Af Amer >60 >60 mL/min    Comment: (NOTE) The eGFR has been calculated using the CKD EPI equation. This calculation has not been validated in all clinical situations. eGFR's persistently <60 mL/min signify possible Chronic Kidney Disease.    Anion gap 11 5 - 15  Blood gas, arterial     Status: Abnormal   Collection Time: 04/09/15  3:45 AM  Result Value Ref Range   FIO2 0.30    Delivery systems VENTILATOR    Mode PRESSURE REGULATED VOLUME CONTROL    VT 360 mL   LHR 12 resp/min   Peep/cpap 5.0 cm H20   pH, Arterial 7.398 7.350 - 7.450   pCO2 arterial 43.5 35.0 - 45.0 mmHg   pO2, Arterial 76.2 (L) 80.0 - 100.0 mmHg   Bicarbonate 26.2 (H) 20.0 - 24.0 mEq/L   TCO2 27.6 0 - 100 mmol/L   Acid-Base Excess 1.9 0.0 - 2.0 mmol/L   O2 Saturation 95.0 %   Patient temperature 98.6    Collection site A-LINE    Drawn by [id number]    Sample type  ARTERIAL   Hepatic  function panel     Status: Abnormal   Collection Time: 04/09/15  6:04 AM  Result Value Ref Range   Total Protein 4.8 (L) 6.5 - 8.1 g/dL   Albumin 2.7 (L) 3.5 - 5.0 g/dL   AST 75 (H) 15 - 41 U/L   ALT 46 14 - 54 U/L   Alkaline Phosphatase 44 38 - 126 U/L   Total Bilirubin 1.1 0.3 - 1.2 mg/dL   Bilirubin, Direct <0.1 (L) 0.1 - 0.5 mg/dL   Indirect Bilirubin NOT CALCULATED 0.3 - 0.9 mg/dL  Protime-INR     Status: Abnormal   Collection Time: 04/09/15  6:04 AM  Result Value Ref Range   Prothrombin Time 18.4 (H) 11.6 - 15.2 seconds   INR 1.52 (H) 0.00 - 1.49  APTT     Status: None   Collection Time: 04/09/15  6:04 AM  Result Value Ref Range   aPTT 36 24 - 37 seconds  Fibrinogen     Status: None   Collection Time: 04/09/15  6:04 AM  Result Value Ref Range   Fibrinogen 283 204 - 475 mg/dL  CBC     Status: Abnormal   Collection Time: 04/09/15  6:04 AM  Result Value Ref Range   WBC 10.6 (H) 4.0 - 10.5 K/uL   RBC 3.75 (L) 3.87 - 5.11 MIL/uL   Hemoglobin 11.1 (L) 12.0 - 15.0 g/dL   HCT 33.6 (L) 36.0 - 46.0 %   MCV 89.6 78.0 - 100.0 fL   MCH 29.6 26.0 - 34.0 pg   MCHC 33.0 30.0 - 36.0 g/dL   RDW 13.6 11.5 - 15.5 %   Platelets 135 (L) 150 - 400 K/uL  Triglycerides     Status: None   Collection Time: 04/09/15  6:04 AM  Result Value Ref Range   Triglycerides 145 <150 mg/dL  Acetaminophen level     Status: Abnormal   Collection Time: 04/09/15  6:05 AM  Result Value Ref Range   Acetaminophen (Tylenol), Serum <10 (L) 10 - 30 ug/mL    Comment:        THERAPEUTIC CONCENTRATIONS VARY SIGNIFICANTLY. A RANGE OF 10-30 ug/mL MAY BE AN EFFECTIVE CONCENTRATION FOR MANY PATIENTS. HOWEVER, SOME ARE BEST TREATED AT CONCENTRATIONS OUTSIDE THIS RANGE. ACETAMINOPHEN CONCENTRATIONS >150 ug/mL AT 4 HOURS AFTER INGESTION AND >50 ug/mL AT 12 HOURS AFTER INGESTION ARE OFTEN ASSOCIATED WITH TOXIC REACTIONS.   I-STAT 3, arterial blood gas (G3+)     Status: Abnormal   Collection  Time: 04/09/15  9:29 AM  Result Value Ref Range   pH, Arterial 7.314 (L) 7.350 - 7.450   pCO2 arterial 50.4 (H) 35.0 - 45.0 mmHg   pO2, Arterial 83.0 80.0 - 100.0 mmHg   Bicarbonate 25.6 (H) 20.0 - 24.0 mEq/L   TCO2 27 0 - 100 mmol/L   O2 Saturation 95.0 %   Acid-base deficit 1.0 0.0 - 2.0 mmol/L   Patient temperature 98.6 F    Collection site FEMORAL ARTERY    Drawn by VP    Sample type ARTERIAL   Basic metabolic panel     Status: Abnormal   Collection Time: 04/09/15 10:04 AM  Result Value Ref Range   Sodium 144 135 - 145 mmol/L   Potassium 2.4 (LL) 3.5 - 5.1 mmol/L    Comment: CRITICAL RESULT CALLED TO, READ BACK BY AND VERIFIED WITH: SATTERFIELD L RN 04/09/15 1050 COSTELLO B    Chloride 121 (H) 101 - 111 mmol/L   CO2 17 (L)  22 - 32 mmol/L   Glucose, Bld 40 (LL) 65 - 99 mg/dL    Comment: CRITICAL RESULT CALLED TO, READ BACK BY AND VERIFIED WITH: SATTERFIELD L RN 04/09/15 1050 COSTELLO B    BUN <5 (L) 6 - 20 mg/dL   Creatinine, Ser 0.52 0.44 - 1.00 mg/dL   Calcium 4.8 (LL) 8.9 - 10.3 mg/dL    Comment: CRITICAL RESULT CALLED TO, READ BACK BY AND VERIFIED WITH: SATTERFIELD L RN 04/09/15 1050 COSTELLO B    GFR calc non Af Amer >60 >60 mL/min   GFR calc Af Amer >60 >60 mL/min    Comment: (NOTE) The eGFR has been calculated using the CKD EPI equation. This calculation has not been validated in all clinical situations. eGFR's persistently <60 mL/min signify possible Chronic Kidney Disease.    Anion gap 6 5 - 15  Protime-INR     Status: Abnormal   Collection Time: 04/09/15 10:04 AM  Result Value Ref Range   Prothrombin Time 25.4 (H) 11.6 - 15.2 seconds   INR 2.34 (H) 0.00 - 1.49  APTT     Status: Abnormal   Collection Time: 04/09/15 10:04 AM  Result Value Ref Range   aPTT 51 (H) 24 - 37 seconds    Comment:        IF BASELINE aPTT IS ELEVATED, SUGGEST PATIENT RISK ASSESSMENT BE USED TO DETERMINE APPROPRIATE ANTICOAGULANT THERAPY.   Fibrinogen     Status: Abnormal    Collection Time: 04/09/15 10:04 AM  Result Value Ref Range   Fibrinogen 170 (L) 204 - 475 mg/dL  CBC     Status: Abnormal   Collection Time: 04/09/15 10:04 AM  Result Value Ref Range   WBC 6.9 4.0 - 10.5 K/uL   RBC 2.39 (L) 3.87 - 5.11 MIL/uL   Hemoglobin 7.1 (L) 12.0 - 15.0 g/dL    Comment: REPEATED TO VERIFY RESULT CALLED TO, READ BACK BY AND VERIFIED WITH: LATANIA, RN AT 1040 ON 11.02.2016 BY COCHRANE S SUGGEST RECOLLECT    HCT 21.6 (L) 36.0 - 46.0 %   MCV 90.4 78.0 - 100.0 fL   MCH 29.7 26.0 - 34.0 pg   MCHC 32.9 30.0 - 36.0 g/dL   RDW 13.6 11.5 - 15.5 %   Platelets 81 (L) 150 - 400 K/uL    Comment: REPEATED TO VERIFY PLATELET COUNT CONFIRMED BY SMEAR RESULT CALLED TO, READ BACK BY AND VERIFIED WITH: LATANIA,RN AT 1040 BY S COCHRAN SUGGEST RECOLLECT   Procalcitonin - Baseline     Status: None   Collection Time: 04/09/15 10:04 AM  Result Value Ref Range   Procalcitonin 1.30 ng/mL    Comment:        Interpretation: PCT > 0.5 ng/mL and <= 2 ng/mL: Systemic infection (sepsis) is possible, but other conditions are known to elevate PCT as well. (NOTE)         ICU PCT Algorithm               Non ICU PCT Algorithm    ----------------------------     ------------------------------         PCT < 0.25 ng/mL                 PCT < 0.1 ng/mL     Stopping of antibiotics            Stopping of antibiotics       strongly encouraged.  strongly encouraged.    ----------------------------     ------------------------------       PCT level decrease by               PCT < 0.25 ng/mL       >= 80% from peak PCT       OR PCT 0.25 - 0.5 ng/mL          Stopping of antibiotics                                             encouraged.     Stopping of antibiotics           encouraged.    ----------------------------     ------------------------------       PCT level decrease by              PCT >= 0.25 ng/mL       < 80% from peak PCT        AND PCT >= 0.5 ng/mL              Continuing antibiotics                                              encouraged.       Continuing antibiotics            encouraged.    ----------------------------     ------------------------------     PCT level increase compared          PCT > 0.5 ng/mL         with peak PCT AND          PCT >= 0.5 ng/mL             Escalation of antibiotics                                          strongly encouraged.      Escalation of antibiotics        strongly encouraged.   Phosphorus     Status: Abnormal   Collection Time: 04/09/15 10:04 AM  Result Value Ref Range   Phosphorus 1.2 (L) 2.5 - 4.6 mg/dL  Magnesium     Status: Abnormal   Collection Time: 04/09/15 10:04 AM  Result Value Ref Range   Magnesium 1.1 (L) 1.7 - 2.4 mg/dL  Glucose, capillary     Status: Abnormal   Collection Time: 04/09/15 11:18 AM  Result Value Ref Range   Glucose-Capillary 205 (H) 65 - 99 mg/dL  Protime-INR     Status: Abnormal   Collection Time: 04/09/15  5:13 PM  Result Value Ref Range   Prothrombin Time 16.6 (H) 11.6 - 15.2 seconds   INR 1.33 0.00 - 1.49  APTT     Status: Abnormal   Collection Time: 04/09/15  5:13 PM  Result Value Ref Range   aPTT 38 (H) 24 - 37 seconds    Comment:        IF BASELINE aPTT IS ELEVATED, SUGGEST PATIENT RISK ASSESSMENT BE USED TO DETERMINE APPROPRIATE ANTICOAGULANT THERAPY.   Fibrinogen     Status: None  Collection Time: 04/09/15  5:13 PM  Result Value Ref Range   Fibrinogen 402 204 - 475 mg/dL  CBC     Status: Abnormal   Collection Time: 04/09/15  5:13 PM  Result Value Ref Range   WBC 9.4 4.0 - 10.5 K/uL   RBC 3.79 (L) 3.87 - 5.11 MIL/uL   Hemoglobin 11.0 (L) 12.0 - 15.0 g/dL    Comment: REPEATED TO VERIFY DELTA CHECK NOTED OK PER RN    HCT 33.9 (L) 36.0 - 46.0 %   MCV 89.4 78.0 - 100.0 fL   MCH 29.0 26.0 - 34.0 pg   MCHC 32.4 30.0 - 36.0 g/dL   RDW 13.5 11.5 - 15.5 %   Platelets 132 (L) 150 - 400 K/uL    Comment: REPEATED TO VERIFY DELTA CHECK NOTED OK  PER RN   Hepatic function panel     Status: Abnormal   Collection Time: 04/09/15  5:14 PM  Result Value Ref Range   Total Protein 5.3 (L) 6.5 - 8.1 g/dL   Albumin 2.9 (L) 3.5 - 5.0 g/dL   AST 79 (H) 15 - 41 U/L   ALT 52 14 - 54 U/L   Alkaline Phosphatase 46 38 - 126 U/L   Total Bilirubin 1.1 0.3 - 1.2 mg/dL   Bilirubin, Direct <0.1 (L) 0.1 - 0.5 mg/dL   Indirect Bilirubin NOT CALCULATED 0.3 - 0.9 mg/dL  Basic metabolic panel     Status: Abnormal   Collection Time: 04/09/15  5:15 PM  Result Value Ref Range   Sodium 140 135 - 145 mmol/L   Potassium 4.1 3.5 - 5.1 mmol/L    Comment: DELTA CHECK NOTED   Chloride 101 101 - 111 mmol/L   CO2 28 22 - 32 mmol/L   Glucose, Bld 96 65 - 99 mg/dL   BUN <5 (L) 6 - 20 mg/dL   Creatinine, Ser 0.92 0.44 - 1.00 mg/dL   Calcium 8.8 (L) 8.9 - 10.3 mg/dL   GFR calc non Af Amer >60 >60 mL/min   GFR calc Af Amer >60 >60 mL/min    Comment: (NOTE) The eGFR has been calculated using the CKD EPI equation. This calculation has not been validated in all clinical situations. eGFR's persistently <60 mL/min signify possible Chronic Kidney Disease.    Anion gap 11 5 - 15  Basic metabolic panel     Status: Abnormal   Collection Time: 04/09/15  8:00 PM  Result Value Ref Range   Sodium 137 135 - 145 mmol/L   Potassium 4.4 3.5 - 5.1 mmol/L   Chloride 101 101 - 111 mmol/L   CO2 29 22 - 32 mmol/L   Glucose, Bld 126 (H) 65 - 99 mg/dL   BUN <5 (L) 6 - 20 mg/dL   Creatinine, Ser 0.77 0.44 - 1.00 mg/dL   Calcium 8.1 (L) 8.9 - 10.3 mg/dL   GFR calc non Af Amer >60 >60 mL/min   GFR calc Af Amer >60 >60 mL/min    Comment: (NOTE) The eGFR has been calculated using the CKD EPI equation. This calculation has not been validated in all clinical situations. eGFR's persistently <60 mL/min signify possible Chronic Kidney Disease.    Anion gap 7 5 - 15  Protime-INR     Status: Abnormal   Collection Time: 04/09/15 11:40 PM  Result Value Ref Range   Prothrombin Time  16.9 (H) 11.6 - 15.2 seconds   INR 1.36 0.00 - 1.49  APTT  Status: None   Collection Time: 04/09/15 11:40 PM  Result Value Ref Range   aPTT 37 24 - 37 seconds    Comment:        IF BASELINE aPTT IS ELEVATED, SUGGEST PATIENT RISK ASSESSMENT BE USED TO DETERMINE APPROPRIATE ANTICOAGULANT THERAPY.   Fibrinogen     Status: None   Collection Time: 04/09/15 11:40 PM  Result Value Ref Range   Fibrinogen 371 204 - 475 mg/dL  CBC     Status: Abnormal   Collection Time: 04/09/15 11:40 PM  Result Value Ref Range   WBC 8.7 4.0 - 10.5 K/uL   RBC 3.51 (L) 3.87 - 5.11 MIL/uL   Hemoglobin 10.5 (L) 12.0 - 15.0 g/dL   HCT 31.5 (L) 36.0 - 46.0 %   MCV 89.7 78.0 - 100.0 fL   MCH 29.9 26.0 - 34.0 pg   MCHC 33.3 30.0 - 36.0 g/dL   RDW 13.6 11.5 - 15.5 %   Platelets 123 (L) 150 - 400 K/uL  Basic metabolic panel     Status: Abnormal   Collection Time: 04/10/15  2:08 AM  Result Value Ref Range   Sodium 140 135 - 145 mmol/L   Potassium 3.4 (L) 3.5 - 5.1 mmol/L    Comment: DELTA CHECK NOTED NO VISIBLE HEMOLYSIS    Chloride 102 101 - 111 mmol/L   CO2 29 22 - 32 mmol/L   Glucose, Bld 90 65 - 99 mg/dL   BUN <5 (L) 6 - 20 mg/dL   Creatinine, Ser 0.68 0.44 - 1.00 mg/dL   Calcium 8.3 (L) 8.9 - 10.3 mg/dL   GFR calc non Af Amer >60 >60 mL/min   GFR calc Af Amer >60 >60 mL/min    Comment: (NOTE) The eGFR has been calculated using the CKD EPI equation. This calculation has not been validated in all clinical situations. eGFR's persistently <60 mL/min signify possible Chronic Kidney Disease.    Anion gap 9 5 - 15  Protime-INR     Status: Abnormal   Collection Time: 04/10/15  2:09 AM  Result Value Ref Range   Prothrombin Time 17.8 (H) 11.6 - 15.2 seconds   INR 1.45 0.00 - 1.49  APTT     Status: None   Collection Time: 04/10/15  2:09 AM  Result Value Ref Range   aPTT 36 24 - 37 seconds  Fibrinogen     Status: None   Collection Time: 04/10/15  2:09 AM  Result Value Ref Range   Fibrinogen 380  204 - 475 mg/dL  CBC     Status: Abnormal   Collection Time: 04/10/15  2:09 AM  Result Value Ref Range   WBC 8.2 4.0 - 10.5 K/uL   RBC 3.52 (L) 3.87 - 5.11 MIL/uL   Hemoglobin 10.2 (L) 12.0 - 15.0 g/dL   HCT 31.5 (L) 36.0 - 46.0 %   MCV 89.5 78.0 - 100.0 fL   MCH 29.0 26.0 - 34.0 pg   MCHC 32.4 30.0 - 36.0 g/dL   RDW 13.4 11.5 - 15.5 %   Platelets 126 (L) 150 - 400 K/uL  Hepatic function panel     Status: Abnormal   Collection Time: 04/10/15  2:09 AM  Result Value Ref Range   Total Protein 4.7 (L) 6.5 - 8.1 g/dL   Albumin 2.6 (L) 3.5 - 5.0 g/dL   AST 61 (H) 15 - 41 U/L   ALT 45 14 - 54 U/L   Alkaline Phosphatase 41 38 - 126  U/L   Total Bilirubin 0.8 0.3 - 1.2 mg/dL   Bilirubin, Direct <0.1 (L) 0.1 - 0.5 mg/dL   Indirect Bilirubin NOT CALCULATED 0.3 - 0.9 mg/dL  Procalcitonin     Status: None   Collection Time: 04/10/15  2:09 AM  Result Value Ref Range   Procalcitonin 1.62 ng/mL    Comment:        Interpretation: PCT > 0.5 ng/mL and <= 2 ng/mL: Systemic infection (sepsis) is possible, but other conditions are known to elevate PCT as well. (NOTE)         ICU PCT Algorithm               Non ICU PCT Algorithm    ----------------------------     ------------------------------         PCT < 0.25 ng/mL                 PCT < 0.1 ng/mL     Stopping of antibiotics            Stopping of antibiotics       strongly encouraged.               strongly encouraged.    ----------------------------     ------------------------------       PCT level decrease by               PCT < 0.25 ng/mL       >= 80% from peak PCT       OR PCT 0.25 - 0.5 ng/mL          Stopping of antibiotics                                             encouraged.     Stopping of antibiotics           encouraged.    ----------------------------     ------------------------------       PCT level decrease by              PCT >= 0.25 ng/mL       < 80% from peak PCT        AND PCT >= 0.5 ng/mL             Continuing  antibiotics                                              encouraged.       Continuing antibiotics            encouraged.    ----------------------------     ------------------------------     PCT level increase compared          PCT > 0.5 ng/mL         with peak PCT AND          PCT >= 0.5 ng/mL             Escalation of antibiotics                                          strongly encouraged.  Escalation of antibiotics        strongly encouraged.   Basic metabolic panel     Status: Abnormal   Collection Time: 04/10/15  4:50 AM  Result Value Ref Range   Sodium 141 135 - 145 mmol/L   Potassium 3.5 3.5 - 5.1 mmol/L   Chloride 104 101 - 111 mmol/L   CO2 29 22 - 32 mmol/L   Glucose, Bld 87 65 - 99 mg/dL   BUN <5 (L) 6 - 20 mg/dL   Creatinine, Ser 0.69 0.44 - 1.00 mg/dL   Calcium 8.2 (L) 8.9 - 10.3 mg/dL   GFR calc non Af Amer >60 >60 mL/min   GFR calc Af Amer >60 >60 mL/min    Comment: (NOTE) The eGFR has been calculated using the CKD EPI equation. This calculation has not been validated in all clinical situations. eGFR's persistently <60 mL/min signify possible Chronic Kidney Disease.    Anion gap 8 5 - 15  Magnesium     Status: Abnormal   Collection Time: 04/10/15  4:50 AM  Result Value Ref Range   Magnesium 1.6 (L) 1.7 - 2.4 mg/dL  Phosphorus     Status: Abnormal   Collection Time: 04/10/15  4:50 AM  Result Value Ref Range   Phosphorus 2.2 (L) 2.5 - 4.6 mg/dL    Current Facility-Administered Medications  Medication Dose Route Frequency Provider Last Rate Last Dose  . acetylcysteine (ACETADOTE) 30,000 mg in dextrose 5 % 750 mL (40 mg/mL) infusion  15 mg/kg/hr Intravenous Continuous Dorrene German, RPH 18 mL/hr at 04/09/15 1900 15 mg/kg/hr at 04/09/15 1900  . albuterol (PROVENTIL) (2.5 MG/3ML) 0.083% nebulizer solution 2.5 mg  2.5 mg Nebulization Q4H PRN Anders Simmonds, MD   2.5 mg at 04/09/15 2124  . alteplase (CATHFLO ACTIVASE) injection 2 mg  2 mg Intracatheter  Once PRN Estanislado Emms, MD      . ampicillin-sulbactam (UNASYN) 1.5 g in sodium chloride 0.9 % 50 mL IVPB  1.5 g Intravenous Q8H Raylene Miyamoto, MD   1.5 g at 04/10/15 0850  . antiseptic oral rinse solution (CORINZ)  7 mL Mouth Rinse QID Javier Glazier, MD   7 mL at 04/10/15 0444  . chlorhexidine gluconate (PERIDEX) 0.12 % solution 15 mL  15 mL Mouth Rinse BID Javier Glazier, MD   15 mL at 04/09/15 2000  . diphenhydrAMINE (BENADRYL) injection 25 mg  25 mg Intravenous Q6H PRN Raylene Miyamoto, MD      . fentaNYL (SUBLIMAZE) 2,500 mcg in sodium chloride 0.9 % 250 mL (10 mcg/mL) infusion  25-400 mcg/hr Intravenous Continuous Archie Patten, MD   Stopped at 04/09/15 0940  . fentaNYL (SUBLIMAZE) bolus via infusion 50 mcg  50 mcg Intravenous Q1H PRN Archie Patten, MD      . fentaNYL (SUBLIMAZE) injection 100 mcg  100 mcg Intravenous Q2H PRN Rahul P Desai, PA-C   100 mcg at 04/08/15 0800  . fentaNYL (SUBLIMAZE) injection 50 mcg  50 mcg Intravenous Once Archie Patten, MD   50 mcg at 04/08/15 1133  . heparin injection 1,000 Units  1,000 Units Dialysis PRN Estanislado Emms, MD      . heparin injection 1,000 Units  20 Units/kg Dialysis PRN Estanislado Emms, MD      . lidocaine (PF) (XYLOCAINE) 1 % injection 5 mL  5 mL Intradermal PRN Estanislado Emms, MD      . lidocaine-prilocaine (EMLA) cream 1 application  1  application Topical PRN Estanislado Emms, MD      . norepinephrine (LEVOPHED) 16 mg in dextrose 5 % 250 mL (0.064 mg/mL) infusion  0-40 mcg/min Intravenous Titrated Raylene Miyamoto, MD   Stopped at 04/08/15 1600  . pantoprazole (PROTONIX) injection 40 mg  40 mg Intravenous Q12H Raylene Miyamoto, MD   40 mg at 04/09/15 2223  . pentafluoroprop-tetrafluoroeth (GEBAUERS) aerosol 1 application  1 application Topical PRN Estanislado Emms, MD      . propofol (DIPRIVAN) 1000 MG/100ML infusion  0-50 mcg/kg/min Intravenous Continuous Rahul P Desai, PA-C   Stopped at 04/08/15 0800     Musculoskeletal: Strength & Muscle Tone: decreased Gait & Station: unable to stand Patient leans: N/A  Psychiatric Specialty Exam: ROS she has SOB and receiving continuous oxygent by canula.  No Fever-chills, No Headache, No changes with Vision or hearing, reports vertigo No problems swallowing food or Liquids, No Chest pain, Cough or Shortness of Breath, No Abdominal pain, No Nausea or Vommitting, Bowel movements are regular, No Blood in stool or Urine, No dysuria, No new skin rashes or bruises, No new joints pains-aches,  No new weakness, tingling, numbness in any extremity, No recent weight gain or loss, No polyuria, polydypsia or polyphagia  A full 10 point Review of Systems was done, except as stated above, all other Review of Systems were negative.  Blood pressure 116/70, pulse 94, temperature 98.8 F (37.1 C), temperature source Oral, resp. rate 16, height 5' (1.524 m), weight 49.669 kg (109 lb 8 oz), SpO2 100 %.Body mass index is 21.39 kg/(m^2).  General Appearance: Guarded  Eye Contact::  Fair  Speech:  Clear and Coherent and Slow  Volume:  Decreased  Mood:  Depressed and Dysphoric  Affect:  Tearful  Thought Process:  Coherent and Goal Directed  Orientation:  Full (Time, Place, and Person)  Thought Content:  Rumination  Suicidal Thoughts:  Yes.  with intent/plan  Homicidal Thoughts:  No  Memory:  Immediate;   Fair Recent;   Fair  Judgement:  Impaired  Insight:  Shallow  Psychomotor Activity:  Decreased  Concentration:  Fair  Recall:  Good  Fund of Knowledge:Good  Language: Good  Akathisia:  Negative  Handed:  Right  AIMS (if indicated):     Assets:  Communication Skills Desire for Improvement Financial Resources/Insurance Housing Leisure Time Resilience Social Support Talents/Skills Transportation Vocational/Educational  ADL's:  Impaired  Cognition: WNL  Sleep:      Treatment Plan Summary: Daily contact with patient to assess and evaluate  symptoms and progress in treatment and Medication management  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  Disposition:  Recommend psychiatric Inpatient admission when medically cleared. Supportive therapy provided about ongoing stressors.  Trenity Pha,JANARDHAHA R. 04/10/2015 9:16 AM

## 2015-04-10 NOTE — Progress Notes (Signed)
PULMONARY / CRITICAL CARE MEDICINE   Name: Amber Frey MRN: [id number] DOB: 09/15/1995    ADMISSION DATE:  04/07/2015 CONSULTATION DATE:  04/07/15  REFERRING MD :  EDP  CHIEF COMPLAINT:  AMS  INITIAL PRESENTATION:  19 y.o. F brought to [facility] ED 10/31 after intentional overdose of tylenol and aspirin as part of suicide attempt.  In ED, she was intubated for airway protection and started on NAC.   STUDIES:  CXR 10/31 >>> normal. CXR 11/2>> new lower lobe atelectasis post intubation, otherwise normal  SIGNIFICANT EVENTS: 10/31 - admitted after intentional overdose of tylenol and aspirin as part of suicide attempt. 11/1- refractory shock, hd emergent, aline, new line placed, levophed 11/2- extubated  SUBJECTIVE:  Extubated breathing on 4L O2 [place].  Patient reports that abdominal pain has improved, with just a little soreness remaining.  She is tolerating clear diet well.  No BM yet.  No additional concerns at this time.  VITAL SIGNS: Temp:  [98.7 F (37.1 C)-99.7 F (37.6 C)] 98.8 F (37.1 C) (11/03 0332) Pulse Rate:  [87-102] 94 (11/02 2000) Resp:  [11-20] 14 (11/03 0600) BP: (95-129)/(47-77) 124/77 mmHg (11/03 0600) SpO2:  [89 %-100 %] 96 % (11/02 2124) Arterial Line BP: (109-119)/(53-55) 119/55 mmHg (11/02 1000) FiO2 (%):  [30 %] 30 % (11/02 0711) Weight:  [109 lb 8 oz (49.669 kg)] 109 lb 8 oz (49.669 kg) (11/03 0446) HEMODYNAMICS: CVP:  [2 mmHg-6 mmHg] 4 mmHg VENTILATOR SETTINGS: Vent Mode:  [-] PRVC FiO2 (%):  [30 %] 30 % Set Rate:  [12 bmp] 12 bmp Vt Set:  [360 mL] 360 mL PEEP:  [5 cmH20] 5 cmH20 Plateau Pressure:  [14 cmH20] 14 cmH20 INTAKE / OUTPUT: Intake/Output      11/02 0701 - 11/03 0700   I.V. (mL/kg) 1003.8 (20.2)   IV Piggyback 1458   Total Intake(mL/kg) 2461.8 (49.6)   Urine (mL/kg/hr) 4210 (3.5)   Total Output 4210   Net -1748.2         PHYSICAL EXAMINATION: General: Young asian female, resting comfortably in bed, NAD, sitter at bedside Neuro:  follows commands, moves all extremities, speech normal HEENT: no trauma, [place] in place Cardiovascular: RRR, no M/R/G  Lungs:  Intermittent coarse breath sounds throughout, normal work of breathing, 4L O2 [place] in place Abdomen: Flat, soft, NT/ND, BS x 4 GU: foley catheter in place, draining clear yellow urine Musculoskeletal: Warm, well perfused, No gross deformities, no edema.  Skin: Intact, warm, no rashes.  LABS:  CBC  Recent Labs Lab 04/09/15 1713 04/09/15 2340 04/10/15 0209  WBC 9.4 8.7 8.2  HGB 11.0* 10.5* 10.2*  HCT 33.9* 31.5* 31.5*  PLT 132* 123* 126*   Coag's  Recent Labs Lab 04/09/15 1713 04/09/15 2340 04/10/15 0209  APTT 38* 37 36  INR 1.33 1.36 1.45   BMET  Recent Labs Lab 04/09/15 2000 04/10/15 0208 04/10/15 0450  NA 137 140 141  K 4.4 3.4* 3.5  CL 101 102 104  CO2 _0 BUN <5* <5* <5*  CREATININE 0.77 0.68 0.69  GLUCOSE 126* 90 87   Electrolytes  Recent Labs Lab 04/08/15 0658  04/09/15 1004  04/09/15 2000 04/10/15 0208 04/10/15 0450  CALCIUM 7.6*  < > 4.8*  < > 8.1* 8.3* 8.2*  MG 1.8  --  1.1*  --   --   --  1.6*  PHOS 1.7*  --  1.2*  --   --   --  2.2*  < > =  values in this interval not displayed. Sepsis Markers  Recent Labs Lab 04/08/15 0915 04/08/15 0919 04/08/15 1045 04/09/15 1004 04/10/15 0209  LATICACIDVEN 1.88 1.96 1.5  --   --   PROCALCITON  --   --   --  1.30 1.62   ABG  Recent Labs Lab 04/08/15 1552 04/09/15 0345 04/09/15 0929  PHART 7.416 7.398 7.314*  PCO2ART 39.1 43.5 50.4*  PO2ART 71.4* 76.2* 83.0   Liver Enzymes  Recent Labs Lab 04/09/15 0604 04/09/15 1714 04/10/15 0209  AST 75* 79* 61*  ALT 46 52 45  ALKPHOS 44 46 41  BILITOT 1.1 1.1 0.8  ALBUMIN 2.7* 2.9* 2.6*   Cardiac Enzymes  Recent Labs Lab 04/08/15 0658  TROPONINI 0.03   Glucose  Recent Labs Lab 04/08/15 0313 04/08/15 0727 04/08/15 1148 04/08/15 1545 04/09/15 1118  GLUCAP 128* 129* 76 95 205*    Imaging Dg Chest  Port 1 View  04/09/2015  CLINICAL DATA:  Extubation. Patient admitted 2 days ago with Tylenol overdose. EXAM: PORTABLE CHEST 1 VIEW COMPARISON:  Portable chest x-ray earlier same date 0402 hr and previously. FINDINGS: Extubation. Right jugular dual-lumen central venous catheter tip projects over the mid SVC, unchanged. Left jugular central venous catheter tip projects at or near the cavoatrial junction, unchanged. New focal consolidation in the medial left lung base since earlier today. Lungs remain clear otherwise. Cardiomediastinal silhouette unremarkable, unchanged. IMPRESSION: 1. New left lower lobe atelectasis post extubation. No acute cardiopulmonary disease otherwise. 2. Remaining support apparatus satisfactory. Electronically Signed   By: Evangeline Dakin M.D.   On: 04/09/2015 16:38    ASSESSMENT / PLAN:  GASTROINTESTINAL A:   Tylenol overdose ? Hematemesis, r/o GI bleed, not impressive Nutrition. P:   NAC per pharmacy, DO NOT dc this, follow LFT, see below Repeat LFTs in am (AST still elevated but improving 61), will dc once normalized, for q12h  Pantoprazole twice a day Clear liquid diet, will advance as able  RENAL A:   Hypokalemia, resolved Hypophosphatemia, 2.2 Hypomag, 1.6 AG + NAG metabolic acidosis - due to ASA + tylenol overdose + lactate + vomiting. AKI. P:   BMP in am Get CVP qshift, noted 6 Replete electrolytes as indicated 2g Mag sulfate IV KPhos  PULMONARY OETT 11/1 >>> A: VDRF due to inability to protect airway following intentional overdose as part of suicide attempt. Respiratory alkalosis with metabolic acidosis - due to ASA ingestion / overdose. Now all uncompensated met acidosis pulm edema 11/2 improved P:   4L O2 [place], wean as tolerated Can tolerate further pos balance  CARDIOVASCULAR HD cath 11/1 >>> A:  Prolonged QTc - in setting hypokalemia, resolved. QTc 430 Refractory shock from OD, hypovolemia P:  Central and a-lines discontinued Dc  line neck as can give all orals Fluids stopped  HEMATOLOGIC A:   Hemoconcentration. VTE Prophylaxis. P:  SCD's only. Repeat CBC in AM. Fluids  INFECTIOUS A:   Concern for aspiration, afebrile x 48 hours P:   Sputum Cx 11/1 >>>NG x1 day Abx:  Zosyn, start date 11/1>>>11/1 Unasyn 11/1>>> PCT assessment, likely we can dc abx  ENDOCRINE A:   No acute issues.   P:   Monitor glucose on BMP, maintain > 100 Clears  NEUROLOGIC A:   Acute metabolic encephalopathy resolved Intentional overdose as part of suicide attempt. P:   Fentanyl PRN Suicide precautions. Psych consulted 11/2, to assess \psych seeing   Family updated: daily  Interdisciplinary Family Meeting v Palliative Care Meeting:  Due by:  11/7Leatrice Jewels M. Lajuana Ripple, DO PGY-2, [facility]  STAFF NOTE: I, Merrie Roof, MD FACP have personally reviewed patient's available data, including medical history, events of note, physical examination and test results as part of my evaluation. I have discussed with resident/NP and other care providers such as pharmacist, RN and RRT. no distress, O2 needs better after lasix, supp, mag, k, phos, pscyh seeing, to floor, sitter, suicide precautions, dc mucomysts, PT, may need lasix further , I was impressed for edema on pcxr, diet advanced, to [place]. Titus Mould, MD, [place] Pgr: [facility] 04/10/2015 6:48 AM

## 2015-04-11 ENCOUNTER — Inpatient Hospital Stay (HOSPITAL_COMMUNITY)
Admission: AD | Admit: 2015-04-11 | Discharge: 2015-04-15 | DRG: 885 | Disposition: A | Discharge status: Home / Self Care | Payer: BLUE CROSS/BLUE SHIELD | Source: Intra-hospital | Attending: Psychiatry | Admitting: Psychiatry

## 2015-04-11 ENCOUNTER — Encounter (HOSPITAL_COMMUNITY): Payer: Self-pay

## 2015-04-11 DIAGNOSIS — T1491 Suicide attempt: Secondary | ICD-10-CM | POA: Diagnosis not present

## 2015-04-11 DIAGNOSIS — T391X1A Poisoning by 4-Aminophenol derivatives, accidental (unintentional), initial encounter: Secondary | ICD-10-CM | POA: Diagnosis present

## 2015-04-11 DIAGNOSIS — T391X1S Poisoning by 4-Aminophenol derivatives, accidental (unintentional), sequela: Secondary | ICD-10-CM | POA: Diagnosis not present

## 2015-04-11 DIAGNOSIS — F419 Anxiety disorder, unspecified: Secondary | ICD-10-CM | POA: Diagnosis present

## 2015-04-11 DIAGNOSIS — R45851 Suicidal ideations: Secondary | ICD-10-CM | POA: Diagnosis not present

## 2015-04-11 DIAGNOSIS — F322 Major depressive disorder, single episode, severe without psychotic features: Secondary | ICD-10-CM | POA: Diagnosis present

## 2015-04-11 DIAGNOSIS — R404 Transient alteration of awareness: Secondary | ICD-10-CM | POA: Diagnosis not present

## 2015-04-11 DIAGNOSIS — T391X2A Poisoning by 4-Aminophenol derivatives, intentional self-harm, initial encounter: Secondary | ICD-10-CM | POA: Diagnosis not present

## 2015-04-11 DIAGNOSIS — K922 Gastrointestinal hemorrhage, unspecified: Secondary | ICD-10-CM | POA: Diagnosis not present

## 2015-04-11 DIAGNOSIS — T1491XA Suicide attempt, initial encounter: Secondary | ICD-10-CM | POA: Diagnosis present

## 2015-04-11 LAB — PROTIME-INR
INR: 1.05 (ref 0.00–1.49)
INR: 1.06 (ref 0.00–1.49)
INR: 1.07 (ref 0.00–1.49)
Prothrombin Time: 13.9 seconds (ref 11.6–15.2)
Prothrombin Time: 14 seconds (ref 11.6–15.2)
Prothrombin Time: 14.1 seconds (ref 11.6–15.2)

## 2015-04-11 LAB — BLOOD GAS, ARTERIAL

## 2015-04-11 LAB — BASIC METABOLIC PANEL
ANION GAP: 7 (ref 5–15)
BUN: <5 mg/dL — ABNORMAL LOW (ref 6–20)
CO2: 26 mmol/L (ref 22–32)
Calcium: 8.4 mg/dL — ABNORMAL LOW (ref 8.9–10.3)
Chloride: 108 mmol/L (ref 101–111)
Creatinine, Ser: 0.6 mg/dL (ref 0.44–1.00)
GFR calc Af Amer: >60 mL/min (ref >60)
GLUCOSE: 93 mg/dL (ref 65–99)
POTASSIUM: 4.3 mmol/L (ref 3.5–5.1)
Sodium: 141 mmol/L (ref 135–145)

## 2015-04-11 MED ORDER — DM-GUAIFENESIN ER 30-600 MG PO TB12
1.0000 | ORAL_TABLET | Freq: Two times a day (BID) | ORAL | Status: DC
  Administered 2015-04-11: 1 via ORAL
  Filled 2015-04-11 (×2): qty 1

## 2015-04-11 MED ORDER — ALUM & MAG HYDROXIDE-SIMETH 200-200-20 MG/5ML PO SUSP: 30.0000 mL | ORAL | Status: DC | PRN

## 2015-04-11 MED ORDER — AMOXICILLIN-POT CLAVULANATE 875-125 MG PO TABS: 1.0000 | ORAL_TABLET | Freq: Two times a day (BID) | ORAL | Status: DC

## 2015-04-11 MED ORDER — ACETAMINOPHEN 325 MG PO TABS: 650.0000 mg | ORAL_TABLET | Freq: Four times a day (QID) | ORAL | Status: DC | PRN

## 2015-04-11 MED ORDER — DIPHENHYDRAMINE HCL 25 MG PO CAPS: 25.0000 mg | ORAL_CAPSULE | Freq: Four times a day (QID) | ORAL | Status: DC | PRN

## 2015-04-11 MED ORDER — PANTOPRAZOLE SODIUM 40 MG PO TBEC
40.0000 mg | DELAYED_RELEASE_TABLET | Freq: Every day | ORAL | Status: DC
  Administered 2015-04-11: 40 mg via ORAL
  Filled 2015-04-11: qty 1

## 2015-04-11 MED ORDER — PANTOPRAZOLE SODIUM 40 MG PO TBEC: 40.0000 mg | DELAYED_RELEASE_TABLET | Freq: Every day | ORAL | Status: DC

## 2015-04-11 MED ORDER — AMOXICILLIN-POT CLAVULANATE 875-125 MG PO TABS
1.0000 | ORAL_TABLET | Freq: Two times a day (BID) | ORAL | Status: DC
  Administered 2015-04-11: 1 via ORAL
  Filled 2015-04-11: qty 1

## 2015-04-11 MED ORDER — DM-GUAIFENESIN ER 30-600 MG PO TB12: 1.0000 | ORAL_TABLET | Freq: Two times a day (BID) | ORAL | Status: DC | PRN

## 2015-04-11 MED ORDER — MAGNESIUM HYDROXIDE 400 MG/5ML PO SUSP: 30.0000 mL | Freq: Every day | ORAL | Status: DC | PRN

## 2015-04-11 NOTE — Progress Notes (Signed)
Adult Psychoeducational Group Note  Date:  04/11/2015 Time:  10:38 PM  Group Topic/Focus:  Wrap-Up Group:   The focus of this group is to help patients review their daily goal of treatment and discuss progress on daily workbooks.  Participation Level:  Active  Participation Quality:  Appropriate  Affect:  Appropriate  Cognitive:  Appropriate  Insight: Good  Engagement in Group:  Engaged  Modes of Intervention:  Activity  Additional Comments:  Patient rated her day a 8 and her goal is to get better.  Natasha MeadKiara M Lulla [place] 04/11/2015, 10:38 PM

## 2015-04-11 NOTE — Clinical Social Work Psych Note (Addendum)
12:51pm- Pt has been assigned to bed 404-2.   Transportation to be arranged for 2pm.  12:40pm- Patient is medically stable and ready per MD order.  Psych CSW sent clinical referrals to local behavioral health hospitals.  [facility] CS[facility].  Assignment pending.  Patient is voluntary and will be transported via [facility] transportation [phone]) [facility].  RN to call report (after bed assignment given) to [phone].     Vickii PennaGina Alianna Wurster, LCSW [phone]  [facility] Licensed Clinical Social Worker

## 2015-04-11 NOTE — Progress Notes (Signed)
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

## 2015-04-11 NOTE — Progress Notes (Signed)
[facility] No answer the first attempt. On second attempt, placed on hold for roughly 5 minutes before hanging up due no one coming back to the phone.

## 2015-04-11 NOTE — Progress Notes (Signed)
[facility], phone transferred to nurse twice with no answer.

## 2015-04-11 NOTE — Consult Note (Signed)
[place] [facility] Consult follow-up  Reason for Consult:  Intentional overdose as a suicide attempt Referring Physician:  Dr. Ashok Cordia Patient Identification: Amber Frey MRN:  [id number] Principal Diagnosis: Overdose on Tylenol Diagnosis:   Patient Active Problem List   Diagnosis Date Noted  . Overdose on Tylenol [T39.1X4A] 04/08/2015  . Acetaminophen overdose [T39.1X4A]   . Metabolic acidosis [W09.8]   . Salicylate overdose [J19.147W]   . Suicide attempt ([place]) [T14.91]   . AKI (acute kidney injury) ([place]) [N17.9]   . Acute respiratory failure ([place]) [J96.00]   . Hypokalemia [E87.6]   . Altered mental status [R41.82]   . Altered level of consciousness [R40.4]   . UGI bleed [K92.2]   . Encounter for central line placement [Z45.2]     Total Time spent with patient: 30 minutes  Subjective:   Amber Frey is a 19 y.o. female patient admitted with Intentional overdose as a suicide attempt.  HPI:  Amber Frey is a 19 year old female, college student admitted to [facility] medical ICU as a status post intentional overdose as an reported suicide attempt.Patient stated being depressed due to stresses from work, school and financial problems. She reportedly wrote suicide note and took a lot of aspirin, tylenol and sudafed overdose with intention to end her life. She was found by family unresponsive and empty bottles near her. She has Acetaminophen was 295 and salicylate level AOZ30.8 on arrival.  Patient did have hematemesis in the emergency department with altered mental status and was intubated. She was extubated yesterday morning and able to cooperate with my evaluation. Patient family is at bed side. She received bicarbonate drip and N acetyl-cysteine and hemodialysis. There was a negative urine drug screen.  Past Psychiatric History: Reported no history of mental illness.  Interval history: Patient seen for psychiatric consultation follow-up today. Patient has been relocated from the  intensive care unit. Patient appeared in her room sitting in a chair and her mom feeding her patient reported she has personal problems which leads her become depressed and tried to kill herself with intentional overdose but does not like to give details during this visit. Patient is willing to participate voluntarily to the inpatient psychiatric hospitalization. Patient has no afferent distress, irritability, agitation or aggressive behavior but she continued to be guarded.    Risk to Self: Is patient at risk for suicide?: No Risk to Others:   Prior Inpatient Therapy:   Prior Outpatient Therapy:    Past Medical History: History reviewed. No pertinent past medical history. History reviewed. No pertinent past surgical history. Family History: History reviewed. No pertinent family history. Family Psychiatric  History: denied Social History:  History  Alcohol Use No     History  Drug Use No    Social History   Social History  . Marital Status: Single    Spouse Name: N/A  . Number of Children: N/A  . Years of Education: N/A   Social History Main Topics  . Smoking status: Never Smoker   . Smokeless tobacco: Never Used  . Alcohol Use: No  . Drug Use: No  . Sexual Activity: No   Other Topics Concern  . None   Social History Narrative   Additional Social History:                          Allergies:  No Known Allergies  Labs:  Results for orders placed or performed during the hospital encounter of 04/07/15 (from the past 48  hour(s))  Protime-INR     Status: Abnormal   Collection Time: 04/09/15  5:13 PM  Result Value Ref Range   Prothrombin Time 16.6 (H) 11.6 - 15.2 seconds   INR 1.33 0.00 - 1.49  APTT     Status: Abnormal   Collection Time: 04/09/15  5:13 PM  Result Value Ref Range   aPTT 38 (H) 24 - 37 seconds    Comment:        IF BASELINE aPTT IS ELEVATED, SUGGEST PATIENT RISK ASSESSMENT BE USED TO DETERMINE APPROPRIATE ANTICOAGULANT THERAPY.    Fibrinogen     Status: None   Collection Time: 04/09/15  5:13 PM  Result Value Ref Range   Fibrinogen 402 204 - 475 mg/dL  CBC     Status: Abnormal   Collection Time: 04/09/15  5:13 PM  Result Value Ref Range   WBC 9.4 4.0 - 10.5 K/uL   RBC 3.79 (L) 3.87 - 5.11 MIL/uL   Hemoglobin 11.0 (L) 12.0 - 15.0 g/dL    Comment: REPEATED TO VERIFY DELTA CHECK NOTED OK PER RN    HCT 33.9 (L) 36.0 - 46.0 %   MCV 89.4 78.0 - 100.0 fL   MCH 29.0 26.0 - 34.0 pg   MCHC 32.4 30.0 - 36.0 g/dL   RDW 13.5 11.5 - 15.5 %   Platelets 132 (L) 150 - 400 K/uL    Comment: REPEATED TO VERIFY DELTA CHECK NOTED OK PER RN   Hepatic function panel     Status: Abnormal   Collection Time: 04/09/15  5:14 PM  Result Value Ref Range   Total Protein 5.3 (L) 6.5 - 8.1 g/dL   Albumin 2.9 (L) 3.5 - 5.0 g/dL   AST 79 (H) 15 - 41 U/L   ALT 52 14 - 54 U/L   Alkaline Phosphatase 46 38 - 126 U/L   Total Bilirubin 1.1 0.3 - 1.2 mg/dL   Bilirubin, Direct <0.1 (L) 0.1 - 0.5 mg/dL   Indirect Bilirubin NOT CALCULATED 0.3 - 0.9 mg/dL  Basic metabolic panel     Status: Abnormal   Collection Time: 04/09/15  5:15 PM  Result Value Ref Range   Sodium 140 135 - 145 mmol/L   Potassium 4.1 3.5 - 5.1 mmol/L    Comment: DELTA CHECK NOTED   Chloride 101 101 - 111 mmol/L   CO2 28 22 - 32 mmol/L   Glucose, Bld 96 65 - 99 mg/dL   BUN <5 (L) 6 - 20 mg/dL   Creatinine, Ser 0.92 0.44 - 1.00 mg/dL   Calcium 8.8 (L) 8.9 - 10.3 mg/dL   GFR calc non Af Amer >60 >60 mL/min   GFR calc Af Amer >60 >60 mL/min    Comment: (NOTE) The eGFR has been calculated using the CKD EPI equation. This calculation has not been validated in all clinical situations. eGFR's persistently <60 mL/min signify possible Chronic Kidney Disease.    Anion gap 11 5 - 15  Basic metabolic panel     Status: Abnormal   Collection Time: 04/09/15  8:00 PM  Result Value Ref Range   Sodium 137 135 - 145 mmol/L   Potassium 4.4 3.5 - 5.1 mmol/L   Chloride 101 101 -  111 mmol/L   CO2 29 22 - 32 mmol/L   Glucose, Bld 126 (H) 65 - 99 mg/dL   BUN <5 (L) 6 - 20 mg/dL   Creatinine, Ser 0.77 0.44 - 1.00 mg/dL   Calcium 8.1 (L) 8.9 -  10.3 mg/dL   GFR calc non Af Amer >60 >60 mL/min   GFR calc Af Amer >60 >60 mL/min    Comment: (NOTE) The eGFR has been calculated using the CKD EPI equation. This calculation has not been validated in all clinical situations. eGFR's persistently <60 mL/min signify possible Chronic Kidney Disease.    Anion gap 7 5 - 15  Protime-INR     Status: Abnormal   Collection Time: 04/09/15 11:40 PM  Result Value Ref Range   Prothrombin Time 16.9 (H) 11.6 - 15.2 seconds   INR 1.36 0.00 - 1.49  APTT     Status: None   Collection Time: 04/09/15 11:40 PM  Result Value Ref Range   aPTT 37 24 - 37 seconds    Comment:        IF BASELINE aPTT IS ELEVATED, SUGGEST PATIENT RISK ASSESSMENT BE USED TO DETERMINE APPROPRIATE ANTICOAGULANT THERAPY.   Fibrinogen     Status: None   Collection Time: 04/09/15 11:40 PM  Result Value Ref Range   Fibrinogen 371 204 - 475 mg/dL  CBC     Status: Abnormal   Collection Time: 04/09/15 11:40 PM  Result Value Ref Range   WBC 8.7 4.0 - 10.5 K/uL   RBC 3.51 (L) 3.87 - 5.11 MIL/uL   Hemoglobin 10.5 (L) 12.0 - 15.0 g/dL   HCT 31.5 (L) 36.0 - 46.0 %   MCV 89.7 78.0 - 100.0 fL   MCH 29.9 26.0 - 34.0 pg   MCHC 33.3 30.0 - 36.0 g/dL   RDW 13.6 11.5 - 15.5 %   Platelets 123 (L) 150 - 400 K/uL  Basic metabolic panel     Status: Abnormal   Collection Time: 04/10/15  2:08 AM  Result Value Ref Range   Sodium 140 135 - 145 mmol/L   Potassium 3.4 (L) 3.5 - 5.1 mmol/L    Comment: DELTA CHECK NOTED NO VISIBLE HEMOLYSIS    Chloride 102 101 - 111 mmol/L   CO2 29 22 - 32 mmol/L   Glucose, Bld 90 65 - 99 mg/dL   BUN <5 (L) 6 - 20 mg/dL   Creatinine, Ser 0.68 0.44 - 1.00 mg/dL   Calcium 8.3 (L) 8.9 - 10.3 mg/dL   GFR calc non Af Amer >60 >60 mL/min   GFR calc Af Amer >60 >60 mL/min    Comment: (NOTE) The  eGFR has been calculated using the CKD EPI equation. This calculation has not been validated in all clinical situations. eGFR's persistently <60 mL/min signify possible Chronic Kidney Disease.    Anion gap 9 5 - 15  Protime-INR     Status: Abnormal   Collection Time: 04/10/15  2:09 AM  Result Value Ref Range   Prothrombin Time 17.8 (H) 11.6 - 15.2 seconds   INR 1.45 0.00 - 1.49  APTT     Status: None   Collection Time: 04/10/15  2:09 AM  Result Value Ref Range   aPTT 36 24 - 37 seconds  Fibrinogen     Status: None   Collection Time: 04/10/15  2:09 AM  Result Value Ref Range   Fibrinogen 380 204 - 475 mg/dL  CBC     Status: Abnormal   Collection Time: 04/10/15  2:09 AM  Result Value Ref Range   WBC 8.2 4.0 - 10.5 K/uL   RBC 3.52 (L) 3.87 - 5.11 MIL/uL   Hemoglobin 10.2 (L) 12.0 - 15.0 g/dL   HCT 31.5 (L) 36.0 - 46.0 %  MCV 89.5 78.0 - 100.0 fL   MCH 29.0 26.0 - 34.0 pg   MCHC 32.4 30.0 - 36.0 g/dL   RDW 13.4 11.5 - 15.5 %   Platelets 126 (L) 150 - 400 K/uL  Hepatic function panel     Status: Abnormal   Collection Time: 04/10/15  2:09 AM  Result Value Ref Range   Total Protein 4.7 (L) 6.5 - 8.1 g/dL   Albumin 2.6 (L) 3.5 - 5.0 g/dL   AST 61 (H) 15 - 41 U/L   ALT 45 14 - 54 U/L   Alkaline Phosphatase 41 38 - 126 U/L   Total Bilirubin 0.8 0.3 - 1.2 mg/dL   Bilirubin, Direct <0.1 (L) 0.1 - 0.5 mg/dL   Indirect Bilirubin NOT CALCULATED 0.3 - 0.9 mg/dL  Procalcitonin     Status: None   Collection Time: 04/10/15  2:09 AM  Result Value Ref Range   Procalcitonin 1.62 ng/mL    Comment:        Interpretation: PCT > 0.5 ng/mL and <= 2 ng/mL: Systemic infection (sepsis) is possible, but other conditions are known to elevate PCT as well. (NOTE)         ICU PCT Algorithm               Non ICU PCT Algorithm    ----------------------------     ------------------------------         PCT < 0.25 ng/mL                 PCT < 0.1 ng/mL     Stopping of antibiotics            Stopping  of antibiotics       strongly encouraged.               strongly encouraged.    ----------------------------     ------------------------------       PCT level decrease by               PCT < 0.25 ng/mL       >= 80% from peak PCT       OR PCT 0.25 - 0.5 ng/mL          Stopping of antibiotics                                             encouraged.     Stopping of antibiotics           encouraged.    ----------------------------     ------------------------------       PCT level decrease by              PCT >= 0.25 ng/mL       < 80% from peak PCT        AND PCT >= 0.5 ng/mL             Continuing antibiotics                                              encouraged.       Continuing antibiotics            encouraged.    ----------------------------     ------------------------------  PCT level increase compared          PCT > 0.5 ng/mL         with peak PCT AND          PCT >= 0.5 ng/mL             Escalation of antibiotics                                          strongly encouraged.      Escalation of antibiotics        strongly encouraged.   Basic metabolic panel     Status: Abnormal   Collection Time: 04/10/15  4:50 AM  Result Value Ref Range   Sodium 141 135 - 145 mmol/L   Potassium 3.5 3.5 - 5.1 mmol/L   Chloride 104 101 - 111 mmol/L   CO2 29 22 - 32 mmol/L   Glucose, Bld 87 65 - 99 mg/dL   BUN <5 (L) 6 - 20 mg/dL   Creatinine, Ser 0.69 0.44 - 1.00 mg/dL   Calcium 8.2 (L) 8.9 - 10.3 mg/dL   GFR calc non Af Amer >60 >60 mL/min   GFR calc Af Amer >60 >60 mL/min    Comment: (NOTE) The eGFR has been calculated using the CKD EPI equation. This calculation has not been validated in all clinical situations. eGFR's persistently <60 mL/min signify possible Chronic Kidney Disease.    Anion gap 8 5 - 15  Magnesium     Status: Abnormal   Collection Time: 04/10/15  4:50 AM  Result Value Ref Range   Magnesium 1.6 (L) 1.7 - 2.4 mg/dL  Phosphorus     Status: Abnormal    Collection Time: 04/10/15  4:50 AM  Result Value Ref Range   Phosphorus 2.2 (L) 2.5 - 4.6 mg/dL  Basic metabolic panel     Status: Abnormal   Collection Time: 04/10/15  8:57 AM  Result Value Ref Range   Sodium 143 135 - 145 mmol/L   Potassium 3.2 (L) 3.5 - 5.1 mmol/L   Chloride 105 101 - 111 mmol/L   CO2 27 22 - 32 mmol/L   Glucose, Bld 95 65 - 99 mg/dL   BUN <5 (L) 6 - 20 mg/dL   Creatinine, Ser 0.59 0.44 - 1.00 mg/dL   Calcium 8.0 (L) 8.9 - 10.3 mg/dL   GFR calc non Af Amer >60 >60 mL/min   GFR calc Af Amer >60 >60 mL/min    Comment: (NOTE) The eGFR has been calculated using the CKD EPI equation. This calculation has not been validated in all clinical situations. eGFR's persistently <60 mL/min signify possible Chronic Kidney Disease.    Anion gap 11 5 - 15  Triglycerides     Status: None   Collection Time: 04/10/15  8:57 AM  Result Value Ref Range   Triglycerides 73 <150 mg/dL  Protime-INR     Status: Abnormal   Collection Time: 04/10/15 12:01 PM  Result Value Ref Range   Prothrombin Time 21.8 (H) 11.6 - 15.2 seconds   INR 1.91 (H) 0.00 - 1.49  APTT     Status: Abnormal   Collection Time: 04/10/15 12:01 PM  Result Value Ref Range   aPTT 47 (H) 24 - 37 seconds    Comment:        IF BASELINE aPTT IS ELEVATED, SUGGEST PATIENT RISK ASSESSMENT  BE USED TO DETERMINE APPROPRIATE ANTICOAGULANT THERAPY.   Protime-INR     Status: None   Collection Time: 04/10/15  5:15 PM  Result Value Ref Range   Prothrombin Time 15.0 11.6 - 15.2 seconds   INR 1.16 0.00 - 1.49  APTT     Status: None   Collection Time: 04/10/15  5:15 PM  Result Value Ref Range   aPTT 34 24 - 37 seconds  Protime-INR     Status: None   Collection Time: 04/10/15 11:55 PM  Result Value Ref Range   Prothrombin Time 13.9 11.6 - 15.2 seconds   INR 1.05 0.00 - 1.49  Protime-INR     Status: None   Collection Time: 04/11/15  4:50 AM  Result Value Ref Range   Prothrombin Time 14.0 11.6 - 15.2 seconds   INR 1.06  0.00 - 0.27  Basic metabolic panel     Status: Abnormal   Collection Time: 04/11/15  4:50 AM  Result Value Ref Range   Sodium 141 135 - 145 mmol/L   Potassium 4.3 3.5 - 5.1 mmol/L    Comment: DELTA CHECK NOTED   Chloride 108 101 - 111 mmol/L   CO2 26 22 - 32 mmol/L   Glucose, Bld 93 65 - 99 mg/dL   BUN <5 (L) 6 - 20 mg/dL   Creatinine, Ser 0.60 0.44 - 1.00 mg/dL   Calcium 8.4 (L) 8.9 - 10.3 mg/dL   GFR calc non Af Amer >60 >60 mL/min   GFR calc Af Amer >60 >60 mL/min    Comment: (NOTE) The eGFR has been calculated using the CKD EPI equation. This calculation has not been validated in all clinical situations. eGFR's persistently <60 mL/min signify possible Chronic Kidney Disease.    Anion gap 7 5 - 15  Protime-INR     Status: None   Collection Time: 04/11/15 10:47 AM  Result Value Ref Range   Prothrombin Time 14.1 11.6 - 15.2 seconds   INR 1.07 0.00 - 1.49    Current Facility-Administered Medications  Medication Dose Route Frequency Provider Last Rate Last Dose  . albuterol (PROVENTIL) (2.5 MG/3ML) 0.083% nebulizer solution 2.5 mg  2.5 mg Nebulization Q4H PRN Anders Simmonds, MD   2.5 mg at 04/09/15 2124  . alteplase (CATHFLO ACTIVASE) injection 2 mg  2 mg Intracatheter Once PRN Estanislado Emms, MD      . amoxicillin-clavulanate (AUGMENTIN) 875-125 MG per tablet 1 tablet  1 tablet Oral Q12H Janece Canterbury, MD   1 tablet at 04/11/15 1300  . antiseptic oral rinse (CPC / CETYLPYRIDINIUM CHLORIDE 0.05%) solution 7 mL  7 mL Mouth Rinse BID Raylene Miyamoto, MD   7 mL at 04/11/15 1052  . dextromethorphan-guaiFENesin (MUCINEX DM) 30-600 MG per 12 hr tablet 1 tablet  1 tablet Oral BID Janece Canterbury, MD   1 tablet at 04/11/15 1300  . diphenhydrAMINE (BENADRYL) capsule 25 mg  25 mg Oral Q6H PRN Jaquita Folds, RPH      . heparin injection 1,000 Units  1,000 Units Dialysis PRN Estanislado Emms, MD      . pantoprazole (PROTONIX) EC tablet 40 mg  40 mg Oral Daily Janece Canterbury, MD   40 mg  at 04/11/15 1300  . potassium chloride SA (K-DUR,KLOR-CON) CR tablet 40 mEq  40 mEq Oral BID Archie Patten, MD   40 mEq at 04/11/15 1032   Current Outpatient Prescriptions  Medication Sig Dispense Refill  . amoxicillin-clavulanate (AUGMENTIN) 875-125 MG tablet  Take 1 tablet by mouth every 12 (twelve) hours. 6 tablet 0  . dextromethorphan-guaiFENesin (MUCINEX DM) 30-600 MG 12hr tablet Take 1 tablet by mouth 2 (two) times daily as needed for cough. 20 tablet 0  . pantoprazole (PROTONIX) 40 MG tablet Take 1 tablet (40 mg total) by mouth daily. 30 tablet 0    Musculoskeletal: Strength & Muscle Tone: decreased Gait & Station: unable to stand Patient leans: N/A  Psychiatric Specialty Exam: ROS  Blood pressure 96/63, pulse 83, temperature 98.4 F (36.9 C), temperature source Oral, resp. rate 19, height 5' (1.524 m), weight 49.669 kg (109 lb 8 oz), SpO2 96 %.Body mass index is 21.39 kg/(m^2).  General Appearance: Guarded  Eye Contact::  Fair  Speech:  Clear and Coherent and Slow  Volume:  Decreased  Mood:  Depressed and Dysphoric  Affect:  Tearful  Thought Process:  Coherent and Goal Directed  Orientation:  Full (Time, Place, and Person)  Thought Content:  Rumination  Suicidal Thoughts:  Yes.  with intent/plan  Homicidal Thoughts:  No  Memory:  Immediate;   Fair Recent;   Fair  Judgement:  Impaired  Insight:  Shallow  Psychomotor Activity:  Decreased  Concentration:  Fair  Recall:  Good  Fund of Knowledge:Good  Language: Good  Akathisia:  Negative  Handed:  Right  AIMS (if indicated):     Assets:  Communication Skills Desire for Improvement Financial Resources/Insurance Housing Leisure Time Resilience Social Support Talents/Skills Transportation Vocational/Educational  ADL's:  Impaired  Cognition: WNL  Sleep:      Treatment Plan Summary: Case discussed with the psychiatric social service who reported patient has inpatient psychiatric bed available at behavioral  [facility] and will be transferring this afternoon Patient is willing to participate voluntarily to the inpatient hospitalization Daily contact with patient to assess and evaluate symptoms and progress in treatment and Medication management  Continue safety sitter - status post suicide attempt  Disposition:  Recommend psychiatric Inpatient admission when medically cleared. Supportive therapy provided about ongoing stressors.  Lise Pincus,JANARDHAHA R. 04/11/2015 2:31 PM

## 2015-04-11 NOTE — Evaluation (Signed)
Physical Therapy Evaluation Patient Details Name: Westly Pamrang Shults MRN: [id number] DOB: 01/29/1996 Today's Date: 04/11/2015   History of Present Illness  19 yo female with tylenol overdose, intubated on admission and now hypoxic at times, unsteady gait  Clinical Impression  Pt was able to get up from bed but had an incident of controlled buckling, but then walked well on the hallways.  Her plan is to continue and will see if her gait is stable, then likely can DC next visit.      Follow Up Recommendations No PT follow up;Other (comment) (behavioral health can assess her)    Equipment Recommendations  None recommended by PT    Recommendations for Other Services       Precautions / Restrictions Precautions Precautions: Fall Restrictions Weight Bearing Restrictions: No      Mobility  Bed Mobility Overal bed mobility: Independent                Transfers Overall transfer level: Independent Equipment used: None                Ambulation/Gait Ambulation/Gait assistance: Supervision (for safety) Ambulation Distance (Feet): 250 Feet Assistive device: None (PT put gait belt on pt for safety) Gait Pattern/deviations: Step-through pattern;Wide base of support;Trunk flexed Gait velocity: normal Gait velocity interpretation: at or above normal speed for age/gender General Gait Details: Pt had a momentary "buckling" incident when in room with her mother but did not fall and seemed able to catch very well.  On the hall with the CNA no incidents took place, just walked with PT holding onto gait belt for safety  Stairs            Wheelchair Mobility    Modified Rankin (Stroke Patients Only)       Balance Overall balance assessment: Needs assistance (for safety) Sitting-balance support: Feet supported Sitting balance-Leahy Scale: Good     Standing balance support: No upper extremity supported Standing balance-Leahy Scale: Good                               Pertinent Vitals/Pain Pain Assessment: No/denies pain    Home Living Family/patient expects to be discharged to:: Private residence Living Arrangements: Parent Available Help at Discharge: Family;Available 24 hours/day Type of Home: House Home Access: Level entry     Home Layout: One level Home Equipment: None      Prior Function Level of Independence: Independent         Comments: college Architectstudent     Hand Dominance        Extremity/Trunk Assessment   Upper Extremity Assessment: Overall WFL for tasks assessed           Lower Extremity Assessment: Overall WFL for tasks assessed      Cervical / Trunk Assessment: Normal  Communication   Communication: No difficulties  Cognition Arousal/Alertness: Awake/alert Behavior During Therapy: WFL for tasks assessed/performed Overall Cognitive Status: Within Functional Limits for tasks assessed                      General Comments General comments (skin integrity, edema, etc.): Due to incident of controlled buckling PT will keep pt on the caseload, and will monitor.  O2 sats after walking were 95%.      Exercises        Assessment/Plan    PT Assessment Patient needs continued PT services  PT Diagnosis Abnormality of gait  PT Problem List Decreased activity tolerance;Other (comment);Decreased balance (gait instability)  PT Treatment Interventions Gait training;Functional mobility training;Therapeutic activities;Balance training;Patient/family education;Neuromuscular re-education   PT Goals (Current goals can be found in the Care Plan section) Acute Rehab PT Goals Patient Stated Goal: none stated PT Goal Formulation: With patient Time For Goal Achievement: 04/18/15 Potential to Achieve Goals: Good    Frequency Min 2X/week   Barriers to discharge Other (comment) (will be going to behavioral health) due to recent events going to inpt treatment    Co-evaluation               End of  Session Equipment Utilized During Treatment: Gait belt Activity Tolerance: Patient tolerated treatment well Patient left: in chair;with call bell/phone within reach;with nursing/sitter in room;with family/visitor present Nurse Communication: Mobility status         Time: 4098-1191 PT Time Calculation (min) (ACUTE ONLY): 18 min   Charges:   PT Evaluation $Initial PT Evaluation Tier I: 1 Procedure     PT G CodesIvar Drape 04-13-15, 1:37 PM   Samul Dada, PT MS Acute Rehab Dept. Number: [phone] [id number] and [facility] [phone]

## 2015-04-11 NOTE — Discharge Summary (Addendum)
Physician Discharge Summary  Amber Frey ZOX:[id number] DOB: 04/02/1996 DOA: 04/07/2015  PCP: No PCP Per Patient  Admit date: 04/07/2015 Discharge date: 04/11/2015  Recommendations for Outpatient Follow-up:  1. Transfer to [facility] for inpatient psychiatry evaluation 2. Repeat CBC and BMP with magnesium at next PCP visit to check anemia, thrombocytopenia, and electrolytes 3. Continue augmentin through 11/7, then stop.  Discharge Diagnoses:  Principal Problem:   Overdose on Tylenol Active Problems:   Altered level of consciousness   UGI bleed   Encounter for central line placement   Discharge Condition: stable, improved  Diet recommendation: regular  Wt Readings from Last 3 Encounters:  04/10/15 49.669 kg (109 lb 8 oz)  02/12/14 48.081 kg (106 lb)  01/19/14 47.174 kg (104 lb)    History of present illness:  19 y.o. F brought to [facility] ED 10/31 after intentional overdose of tylenol and aspirin as part of suicide attempt. She was found unresponsive at home by her family next to bottles of aspirin, sudafed, and tylenol.  She had written a suicide note requesting forgiveness and asking her family to take care of her bills.  In ED, she was intubated for airway protection and started on NAC.   10/31 - admitted after intentional overdose of tylenol and aspirin as part of suicide attempt. 11/1- refractory shock, hd emergent, aline, new line placed, levophed 11/2- extubated and off vasopressors 11/3 lines removed 11/4 - transitioned to room air  Hospital Course:   Intentional overdose with tylenol and aspirin.  Her initial tylenol level was 217 and her salicylate level was 45.5.  She was admitted to the intensive care unit and required intubation for two days and vasopressors support.  She was given N-acetylcysteine per protocol and emergent hemodialysis x 4 hours which improved her mentation.  She was also started on NaHCO3 infusion and given fomepizole at the request of poison control due to  her concerning degree of metabolic acidosis at admission.  She had a transaminitis that resolved and her INR remained stable.  She was extubated on 11/2 and completed her NAC the same day at which time both her tylenol and ASA levels were undetectable.  She was transitioned gradually to room air and her blood pressure remained stable.  She was seen by psychiatry who recommended transfer to inpatient psychiatry.    Mild hematemesis was likely due to gastritis from pill consumption and resolved with PPI.  Given 1 month prescription for PPI.    Hypokalemia, hypomagnesemia, and hypophosphatemia resolved with oral and IV supplementation.    She had a profound anion gap acidosis present at admission was probably secondary to aspirin overdose and lactic acidosis.  Her bicarb was 7 with anion gap of 23.  There was concern for ethylene glycol toxicity.  See above.  Her acidosis gradually resolved with sodium bicarb infusion, HD, and hyperventilation while intubated.    Aspiration pneumonia with course breath sounds on exam and evidence of pneumonia on CXR, consistent with history.  She was given IV antibiotics and transitioned to augmentin on 11/4 to continue through 11/7, then stop.     Consultations:  PCCM  Nephrology  Psychiatry  Discharge Exam: Filed Vitals:   04/11/15 0501  BP: 96/63  Pulse: 83  Temp: 98.4 F (36.9 C)  Resp: 19   Filed Vitals:   04/10/15 1800 04/10/15 1842 04/10/15 2107 04/11/15 0501  BP: 123/70 118/67 119/61 96/63  Pulse:  84 104 83  Temp:  99.2 F (37.3 C) 98.4 F (36.9 C)  98.4 F (36.9 C)  TempSrc:  Oral Oral Oral  Resp: Height:      Weight:      SpO2:  94% 96% 96%    General: thin female, NAD Cardiovascular: RRR, no mrg Respiratory: Course rales at bilateral bases, with rhonchi, no wheeze ABD:  NABS, soft, ND/ND MSK:  No LEE, normal tone and bulk  Discharge Instructions      Discharge Instructions    Call MD for:  difficulty  breathing, headache or visual disturbances    Complete by:  As directed      Call MD for:  extreme fatigue    Complete by:  As directed      Call MD for:  hives    Complete by:  As directed      Call MD for:  persistant dizziness or light-headedness    Complete by:  As directed      Call MD for:  persistant nausea and vomiting    Complete by:  As directed      Call MD for:  severe uncontrolled pain    Complete by:  As directed      Call MD for:  temperature >100.4    Complete by:  As directed      Diet general    Complete by:  As directed      Increase activity slowly    Complete by:  As directed             Medication List    STOP taking these medications        ibuprofen 200 MG tablet  Commonly known as:  ADVIL,MOTRIN     norgestimate-ethinyl estradiol 0.25-35 MG-MCG tablet  Commonly known as:  SPRINTEC 28      TAKE these medications        amoxicillin-clavulanate 875-125 MG tablet  Commonly known as:  AUGMENTIN  Take 1 tablet by mouth every 12 (twelve) hours.     dextromethorphan-guaiFENesin 30-600 MG 12hr tablet  Commonly known as:  MUCINEX DM  Take 1 tablet by mouth 2 (two) times daily as needed for cough.     pantoprazole 40 MG tablet  Commonly known as:  PROTONIX  Take 1 tablet (40 mg total) by mouth daily.          The results of significant diagnostics from this hospitalization (including imaging, microbiology, ancillary and laboratory) are listed below for reference.    Significant Diagnostic Studies: Dg Abd 1 View  04/08/2015  CLINICAL DATA:  Check orogastric catheter placement EXAM: ABDOMEN - 1 VIEW COMPARISON:  None. FINDINGS: A gastric catheter is noted within the midportion of the stomach. Scattered large and small bowel gas is noted. No free air is seen. No bony abnormality is noted. IMPRESSION: OG catheter within the stomach. Electronically Signed   By: Alcide Clever M.D.   On: 04/08/2015 01:43   Ct Head Wo Contrast  04/08/2015  CLINICAL  DATA:  Overdose.  Altered consciousness. EXAM: CT HEAD WITHOUT CONTRAST TECHNIQUE: Contiguous axial images were obtained from the base of the skull through the vertex without intravenous contrast. COMPARISON:  None. FINDINGS: There is no intracranial hemorrhage, mass or evidence of acute infarction. There is no extra-axial fluid collection. Gray matter and white matter appear normal. Cerebral volume is normal for age. Brainstem and posterior fossa are unremarkable. The CSF spaces appear normal. The bony structures are intact. The visible portions of the paranasal sinuses are clear. IMPRESSION: Normal brain  Electronically Signed   By: Ellery Plunkaniel R Mitchell M.D.   On: 04/08/2015 03:37   Dg Chest Port 1 View  04/09/2015  CLINICAL DATA:  Extubation. Patient admitted 2 days ago with Tylenol overdose. EXAM: PORTABLE CHEST 1 VIEW COMPARISON:  Portable chest x-ray earlier same date 0402 hr and previously. FINDINGS: Extubation. Right jugular dual-lumen central venous catheter tip projects over the mid SVC, unchanged. Left jugular central venous catheter tip projects at or near the cavoatrial junction, unchanged. New focal consolidation in the medial left lung base since earlier today. Lungs remain clear otherwise. Cardiomediastinal silhouette unremarkable, unchanged. IMPRESSION: 1. New left lower lobe atelectasis post extubation. No acute cardiopulmonary disease otherwise. 2. Remaining support apparatus satisfactory. Electronically Signed   By: Hulan Saashomas  Lawrence M.D.   On: 04/09/2015 16:38   Dg Chest Port 1 View  04/09/2015  CLINICAL DATA:  Overdose, altered level of consciousness, intubated patient, acute respiratory failure. EXAM: PORTABLE CHEST 1 VIEW COMPARISON:  Portable chest x-ray of April 08, 2015 FINDINGS: The lungs are well-expanded and clear. The heart and pulmonary vascularity are normal. The mediastinum is normal in width. The endotracheal tube tip lies 3 cm above the carina. The esophagogastric tube tip  projects below the inferior margin of the image. The left internal jugular venous catheter tip projects over the midportion of the SVC. The right internal jugular catheter tip projects over the proximal SVC. IMPRESSION: 1. There is no active cardiopulmonary disease. 2. The support tubes are in reasonable position. Electronically Signed   By: David  SwazilandJordan M.D.   On: 04/09/2015 08:56   Dg Chest Port 1 View  04/08/2015  CLINICAL DATA:  Central line placement. EXAM: PORTABLE CHEST 1 VIEW COMPARISON:  04/08/2015 FINDINGS: Left internal jugular central line has been placed with the tip at the cavoatrial junction. No pneumothorax. Right central line, endotracheal tube and NG tube are unchanged. Lungs are clear. Heart is normal size. No effusions. IMPRESSION: Left central line placement. Tip is at the cavoatrial junction. No pneumothorax. No active disease. Electronically Signed   By: Charlett NoseKevin  Dover M.D.   On: 04/08/2015 09:49   Dg Chest Portable 1 View  04/08/2015  CLINICAL DATA:  Status post central line placement EXAM: PORTABLE CHEST - 1 VIEW COMPARISON:  04/07/2015 FINDINGS: Cardiac shadow is within normal limits. A nasogastric catheter is noted extending into the stomach. Endotracheal tube is seen with the tip 3.9 cm above the carina. A right jugular line is noted in the mid superior vena cava. No pneumothorax is seen. No bony abnormality is noted. The lungs are clear. The IMPRESSION: Tubes and lines as described. No acute complicating factors are noted. Electronically Signed   By: Alcide CleverMark  Lukens M.D.   On: 04/08/2015 01:44   Dg Chest Portable 1 View  04/07/2015  CLINICAL DATA:  Vomiting.  Overdose. EXAM: PORTABLE CHEST 1 VIEW COMPARISON:  None. FINDINGS: The heart size and mediastinal contours are within normal limits. Both lungs are clear. The visualized skeletal structures are unremarkable. IMPRESSION: Normal examination. Electronically Signed   By: Beckie SaltsSteven  Reid M.D.   On: 04/07/2015 21:58     Microbiology: Recent Results (from the past 240 hour(s))  MRSA PCR Screening     Status: None   Collection Time: 04/08/15  3:36 AM  Result Value Ref Range Status   MRSA by PCR NEGATIVE NEGATIVE Final    Comment:        The GeneXpert MRSA Assay (FDA approved for NASAL specimens only), is one component of a  comprehensive MRSA colonization surveillance program. It is not intended to diagnose MRSA infection nor to guide or monitor treatment for MRSA infections.   Culture, respiratory (NON-Expectorated)     Status: None   Collection Time: 04/08/15  4:50 AM  Result Value Ref Range Status   Specimen Description TRACHEAL ASPIRATE  Final   Special Requests NONE  Final   Gram Stain   Final    RARE WBC PRESENT,BOTH PMN AND MONONUCLEAR NO SQUAMOUS EPITHELIAL CELLS SEEN NO ORGANISMS SEEN Performed at [facility]    Culture   Final    NO GROWTH 2 DAYS Performed at [facility]    Report Status 04/10/2015 FINAL  Final  Culture, blood (routine x 2)     Status: None (Preliminary result)   Collection Time: 04/08/15 10:20 AM  Result Value Ref Range Status   Specimen Description BLOOD CENTRAL LINE  Final   Special Requests BOTTLES DRAWN AEROBIC AND ANAEROBIC 10CC  Final   Culture NO GROWTH 2 DAYS  Final   Report Status PENDING  Incomplete  Culture, blood (routine x 2)     Status: None (Preliminary result)   Collection Time: 04/08/15 10:24 AM  Result Value Ref Range Status   Specimen Description BLOOD LEFT HAND  Final   Special Requests IN PEDIATRIC BOTTLE  2CC  Final   Culture NO GROWTH 2 DAYS  Final   Report Status PENDING  Incomplete     Labs: Basic Metabolic Panel:  Recent Labs Lab 04/07/15 2201 04/08/15 0658  04/09/15 1004  04/09/15 2000 04/10/15 0208 04/10/15 0450 04/10/15 0857 04/11/15 0450  NA 138 148*  < > 144  < > 137 140 141 143 141  K 3.4* 3.3*  < > 2.4*  < > 4.4 3.4* 3.5 3.2* 4.3  CL 108 112*  < > 121*  < > 101 102 104 105 108  CO2 7*  17*  < > 17*  < > GLUCOSE 180* 143*  < > 40*  < > 126* 90 87 95 93  BUN 13 11  < > <5*  < > <5* <5* <5* <5* <5*  CREATININE 1.16* 1.38*  < > 0.52  < > 0.77 0.68 0.69 0.59 0.60  CALCIUM 9.2 7.6*  < > 4.8*  < > 8.1* 8.3* 8.2* 8.0* 8.4*  MG 2.3 1.8  --  1.1*  --   --   --  1.6*  --   --   PHOS  --  1.7*  --  1.2*  --   --   --  2.2*  --   --   < > = values in this interval not displayed. Liver Function Tests:  Recent Labs Lab 04/08/15 1544 04/08/15 2206 04/09/15 0604 04/09/15 1714 04/10/15 0209  AST 65* 73* 75* 79* 61*  ALT 38 43 46 52 45  ALKPHOS 45 41 44 46 41  BILITOT 1.2 0.9 1.1 1.1 0.8  PROT 5.3* 4.8* 4.8* 5.3* 4.7*  ALBUMIN 3.1* 2.8* 2.7* 2.9* 2.6*   No results for input(s): LIPASE, AMYLASE in the last 168 hours.  Recent Labs Lab 04/08/15 0449  AMMONIA 34   CBC:  Recent Labs Lab 04/07/15 2201  04/09/15 0604 04/09/15 1004 04/09/15 1713 04/09/15 2340 04/10/15 0209  WBC 40.4*  < > 10.6* 6.9 9.4 8.7 8.2  NEUTROABS 34.0*  --   --   --   --   --   --   HGB  17.2*  < > 11.1* 7.1* 11.0* 10.5* 10.2*  HCT 51.4*  < > 33.6* 21.6* 33.9* 31.5* 31.5*  MCV 91.9  < > 89.6 90.4 89.4 89.7 89.5  PLT 396  < > 135* 81* 132* 123* 126*  < > = values in this interval not displayed. Cardiac Enzymes:  Recent Labs Lab 04/08/15 0658  TROPONINI 0.03   BNP: BNP (last 3 results) No results for input(s): BNP in the last 8760 hours.  ProBNP (last 3 results) No results for input(s): PROBNP in the last 8760 hours.  CBG:  Recent Labs Lab 04/08/15 0313 04/08/15 0727 04/08/15 1148 04/08/15 1545 04/09/15 1118  GLUCAP 128* 129* 76 95 205*    Time coordinating discharge: 35 minutes  Signed:  Cadin Luka  [facility] 04/11/2015, 12:31 PM

## 2015-04-12 DIAGNOSIS — F322 Major depressive disorder, single episode, severe without psychotic features: Secondary | ICD-10-CM | POA: Diagnosis present

## 2015-04-12 DIAGNOSIS — T391X2A Poisoning by 4-Aminophenol derivatives, intentional self-harm, initial encounter: Secondary | ICD-10-CM

## 2015-04-12 DIAGNOSIS — T1491 Suicide attempt: Secondary | ICD-10-CM

## 2015-04-12 MED ORDER — MENTHOL 3 MG MT LOZG: 1.0000 | LOZENGE | OROMUCOSAL | Status: DC | PRN

## 2015-04-12 MED ORDER — IBUPROFEN 600 MG PO TABS
600.0000 mg | ORAL_TABLET | Freq: Four times a day (QID) | ORAL | Status: DC | PRN
  Administered 2015-04-12: 600 mg via ORAL

## 2015-04-12 NOTE — H&P (Signed)
Psychiatric Admission Assessment Adult  Patient Identification: Amber Frey MRN:  [id number] Date of Evaluation:  04/12/2015 Chief Complaint:  "I overdosed" Principal Diagnosis: MDD (major depressive disorder), single episode, severe , no psychosis ([place]) Diagnosis:   Patient Active Problem List   Diagnosis Date Noted  . MDD (major depressive disorder), single episode, severe , no psychosis ([place]) [F32.2] 04/12/2015    Priority: High  . Acetaminophen overdose [T39.1X4A]     Priority: High  . Suicide attempt [facility]) [T14.91]     Priority: High  . Overdose on Tylenol [T39.1X4A] 04/08/2015  . Metabolic acidosis [E31.5]   . Salicylate overdose [Q00.867Y]   . AKI (acute kidney injury) ([place]) [N17.9]   . Acute respiratory failure ([place]) [J96.00]   . Hypokalemia [E87.6]   . Altered mental status [R41.82]   . Altered level of consciousness [R40.4]   . UGI bleed [K92.2]   . Encounter for central line placement [Z45.2]    History of Present Illness::  Per inpatient H&P upon arrival to hospital: Patient seen and examined with physician's assistant. Please refer to his admission H&P. Patient is a 19 year old female status post intentional overdose as a suicide attempt. Serum lab testing shows elevation of both Tylenol and acetaminophen with a negative urine drug screen. Patient did have hematemesis in the emergency department with altered mental status and inability to protect her airway requiring endotracheal intubation. Per report the patient was last seen normal between 11 AM & 3 PM on 10/31. She was found unresponsive at 8:30 PM on 10/31. Bottles of aspirin, Sudafed, & Tylenol or found near her. At this time the patient has a significant metabolic acidosis from elevated lactic acid. Psychiatry consulted with her and she agreed to come inpatient voluntarily.  Upon arrival to [facility], H&P performed on 04/12/15. Pt seen and chart reviewed. Pt is alert/oriented x4, calm, cooperative and appropriate to  situation. Pt is joking during assessment and smiling appropriately. Pt denies current suicidal/homicidal ideation and psychosis and reports that she was overwhelmed due to an acute stressor. Pt reports she slept well last night and affirms good appetite. Pt described a relationship with a man who has a wife and 3 kids to whom she was a "friend of the family" and became involved romantically with this man. Pt reports that the wife found out and the husband (her boyfriend) arranged a "family meeting" with many family members including her boyfriend, his wife, and his wife's parents. Pt reports that the thought of this meeting was so stressful that she could not handle it and impulsively overdosed. Pt denies any history of such attempts or self-injurious behaviors and/or any psychiatric history or medication history whatsoever. 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    Associated Signs/Symptoms: Depression Symptoms:  psychomotor agitation, feelings of worthlessness/guilt, difficulty concentrating, hopelessness, suicidal attempt, anxiety, (Hypo) Manic Symptoms:  Impulsivity, Anxiety Symptoms:  Excessive Worry, Psychotic Symptoms:  Denies PTSD Symptoms: NA Total Time spent with patient: 45 minutes  Past Psychiatric History: Denies  Risk to Self:   Risk to Others:   Prior Inpatient Therapy:   Prior Outpatient Therapy:    Alcohol Screening:   Substance Abuse History in the last 12 months:  No. Consequences of Substance Abuse: NA Previous Psychotropic Medications: No  Psychological Evaluations: No  Past Medical History: No past medical history on file. No past surgical history on file. Family History: No family  history on file. Family Psychiatric  History: Denies Social History:  History  Alcohol Use No     History  Drug Use No    Social History   Social History  . Marital Status: Single     Spouse Name: N/A  . Number of Children: N/A  . Years of Education: N/A   Social History Main Topics  . Smoking status: Never Smoker   . Smokeless tobacco: Never Used  . Alcohol Use: No  . Drug Use: No  . Sexual Activity: No   Other Topics Concern  . Not on file   Social History Narrative   Additional Social History:                         Allergies:  No Known Allergies Lab Results:  Results for orders placed or performed during the hospital encounter of 04/07/15 (from the past 48 hour(s))  Protime-INR     Status: Abnormal   Collection Time: 04/10/15 12:01 PM  Result Value Ref Range   Prothrombin Time 21.8 (H) 11.6 - 15.2 seconds   INR 1.91 (H) 0.00 - 1.49  APTT     Status: Abnormal   Collection Time: 04/10/15 12:01 PM  Result Value Ref Range   aPTT 47 (H) 24 - 37 seconds    Comment:        IF BASELINE aPTT IS ELEVATED, SUGGEST PATIENT RISK ASSESSMENT BE USED TO DETERMINE APPROPRIATE ANTICOAGULANT THERAPY.   Protime-INR     Status: None   Collection Time: 04/10/15  5:15 PM  Result Value Ref Range   Prothrombin Time 15.0 11.6 - 15.2 seconds   INR 1.16 0.00 - 1.49  APTT     Status: None   Collection Time: 04/10/15  5:15 PM  Result Value Ref Range   aPTT 34 24 - 37 seconds  Protime-INR     Status: None   Collection Time: 04/10/15 11:55 PM  Result Value Ref Range   Prothrombin Time 13.9 11.6 - 15.2 seconds   INR 1.05 0.00 - 1.49  Protime-INR     Status: None   Collection Time: 04/11/15  4:50 AM  Result Value Ref Range   Prothrombin Time 14.0 11.6 - 15.2 seconds   INR 1.06 0.00 - 1.49  Basic metabolic panel     Status: Abnormal   Collection Time: 04/11/15  4:50 AM  Result Value Ref Range   Sodium 141 135 - 145 mmol/L   Potassium 4.3 3.5 - 5.1 mmol/L    Comment: DELTA CHECK NOTED   Chloride 108 101 - 111 mmol/L   CO2 26 22 - 32 mmol/L   Glucose, Bld 93 65 - 99 mg/dL   BUN <5 (L) 6 - 20 mg/dL   Creatinine, Ser 0.60 0.44 - 1.00 mg/dL   Calcium  8.4 (L) 8.9 - 10.3 mg/dL   GFR calc non Af Amer >60 >60 mL/min   GFR calc Af Amer >60 >60 mL/min    Comment: (NOTE) The eGFR has been calculated using the CKD EPI equation. This calculation has not been validated in all clinical situations. eGFR's persistently <60 mL/min signify possible Chronic Kidney Disease.    Anion gap 7 5 - 15  Protime-INR     Status: None   Collection Time: 04/11/15 10:47 AM  Result Value Ref Range   Prothrombin Time 14.1 11.6 - 15.2 seconds   INR 1.07 0.00 - 1.49    Metabolic Disorder Labs:  No   results found for: HGBA1C, MPG No results found for: PROLACTIN Lab Results  Component Value Date   TRIG 73 04/10/2015    Current Medications: Current Facility-Administered Medications  Medication Dose Route Frequency Provider Last Rate Last Dose  . acetaminophen (TYLENOL) tablet 650 mg  650 mg Oral Q6H PRN Harriet Butte, NP      . alum & mag hydroxide-simeth (MAALOX/MYLANTA) 200-200-20 MG/5ML suspension 30 mL  30 mL Oral Q4H PRN Harriet Butte, NP      . magnesium hydroxide (MILK OF MAGNESIA) suspension 30 mL  30 mL Oral Daily PRN Harriet Butte, NP       PTA Medications: Prescriptions prior to admission  Medication Sig Dispense Refill Last Dose  . amoxicillin-clavulanate (AUGMENTIN) 875-125 MG tablet Take 1 tablet by mouth every 12 (twelve) hours. 6 tablet 0   . dextromethorphan-guaiFENesin (MUCINEX DM) 30-600 MG 12hr tablet Take 1 tablet by mouth 2 (two) times daily as needed for cough. 20 tablet 0   . pantoprazole (PROTONIX) 40 MG tablet Take 1 tablet (40 mg total) by mouth daily. 30 tablet 0     Musculoskeletal: Strength & Muscle Tone: within normal limits Gait & Station: normal Patient leans: N/A  Psychiatric Specialty Exam: Physical Exam  Review of Systems  Psychiatric/Behavioral: Positive for depression and suicidal ideas (yes as OD PTA yet pt now denies). Negative for substance abuse. The patient is nervous/anxious. The patient does not have  insomnia.   All other systems reviewed and are negative.   Blood pressure 104/48, pulse 92, temperature 98.1 F (36.7 C), temperature source Oral, resp. rate 16, height 5' 2" (1.575 m), weight 48.081 kg (106 lb), SpO2 100 %.Body mass index is 19.38 kg/(m^2).  General Appearance: Casual and Fairly Groomed  Engineer, water::  Good  Speech:  Clear and Coherent and Normal Rate  Volume:  Normal  Mood:  Euthymic  Affect:  Appropriate and Congruent  Thought Process:  Coherent and Goal Directed  Orientation:  Full (Time, Place, and Person)  Thought Content:  WDL  Suicidal Thoughts:  No  Homicidal Thoughts:  No  Memory:  Immediate;   Fair Recent;   Fair Remote;   Fair  Judgement:  Fair  Insight:  Fair  Psychomotor Activity:  Normal  Concentration:  Fair  Recall:  [employer] of Knowledge:Fair  Language: Fair  Akathisia:  No  Handed:    AIMS (if indicated):     Assets:  Communication Skills Desire for Improvement Physical Health Resilience Social Support  ADL's:  Intact  Cognition: WNL  Sleep:      Treatment Plan Summary: Daily contact with patient to assess and evaluate symptoms and progress in treatment  Medications:  -Pt declines medication management at this time to both this NP and Dr. Doyne Keel   Observation Level/Precautions:  15 minute checks  Laboratory:  Labs resulted, reviewed, and stable at this time.   Psychotherapy:  Group therapy, individual therapy, psychoeducation  Medications:  See MAR above  Consultations: None    Discharge Concerns: None    Estimated LOS: 5-7 days  Other:  N/A    I certify that inpatient services furnished can reasonably be expected to improve the patient's condition.    Sayf Kerner C, FNP-BC 11/5/201610:23 AM

## 2015-04-12 NOTE — Plan of Care (Signed)
Problem: Diagnosis: Increased Risk For Suicide Attempt Goal: STG-Patient Will Attend All Groups On The Unit Outcome: Progressing Pt partially attended group. Pt did respond to the group leader's questions.

## 2015-04-12 NOTE — [facility] Suicide Risk Assessment (Signed)
[facility] Admission Suicide Risk Assessment   Nursing information obtained from:    pt Demographic factors:    live with mom, brother, sister in law and niece; employed and a full time student Current Mental Status:    recent SA by impulsively OD Loss Factors:    states she disappointed her family by getting into a relationship with a married man. They broke up the day before she attempted suicide Historical Factors:    denies hx of prior suicide attempt, denies family hx of MI and SA. Pt denies substance and alcohol abuse Risk Reduction Factors:   living with family. Employed. Positive social support Total Time spent with patient: 30 minutes Principal Problem: Acetaminophen overdose Diagnosis:   Patient Active Problem List   Diagnosis Date Noted  . Overdose on Tylenol [T39.1X4A] 04/08/2015  . Acetaminophen overdose [T39.1X4A]   . Metabolic acidosis [E87.2]   . Salicylate overdose [T39.091A]   . Suicide attempt (HCC) [T14.91]   . AKI (acute kidney injury) (HCC) [N17.9]   . Acute respiratory failure (HCC) [J96.00]   . Hypokalemia [E87.6]   . Altered mental status [R41.82]   . Altered level of consciousness [R40.4]   . UGI bleed [K92.2]   . Encounter for central line placement [Z45.2]      Continued Clinical Symptoms:    The "Alcohol Use Disorders Identification Test", Guidelines for Use in Primary Care, Second Edition.  World Science writer [facility]). Score between 0-7:  no or low risk or alcohol related problems. Score between 8-15:  moderate risk of alcohol related problems. Score between 16-19:  high risk of alcohol related problems. Score 20 or above:  warrants further diagnostic evaluation for alcohol dependence and treatment.   CLINICAL FACTORS:    Denies depression, anhedonia, low motivation, worthlessness and hopelessness. Today denies SI/HI. Pt states she disappointed her family by getting into a relationship with a married man. They broke up and pt was scared to tell her  family so she impulsively OD on Tylenol in a suicide attempt. Today states she regrets it and denies any further SI. Sleep is good. Today declined treatment with antidepressants.   Pt denies any past psych hx including dx, meds, prior attempts and inpt psych hospitalizations.    Musculoskeletal: Strength & Muscle Tone: within normal limits Gait & Station: normal Patient leans: N/A  Psychiatric Specialty Exam: Physical Exam  Review of Systems  Constitutional: Negative for fever and chills.  HENT: Negative for congestion and sore throat.   Respiratory: Positive for cough. Negative for sputum production and shortness of breath.   Cardiovascular: Negative for chest pain and leg swelling.  Gastrointestinal: Negative for heartburn, nausea, vomiting and abdominal pain.  Musculoskeletal: Negative for back pain, joint pain and neck pain.  Skin: Negative for itching and rash.  Neurological: Negative for dizziness, tremors, seizures, loss of consciousness and headaches.  Psychiatric/Behavioral: Negative for depression, suicidal ideas, hallucinations and substance abuse. The patient is not nervous/anxious and does not have insomnia.     Blood pressure 104/48, pulse 92, temperature 98.1 F (36.7 C), temperature source Oral, resp. rate 16, height  (1.575 m), weight 48.081 kg (106 lb), SpO2 100 %.Body mass index is 19.38 kg/(m^2).  General Appearance: Casual  Eye Contact::  Good  Speech:  Clear and Coherent and Normal Rate  Volume:  Normal  Mood:  Euthymic  Affect:  Appropriate  Thought Process:  Goal Directed  Orientation:  Full (Time, Place, and Person)  Thought Content:  Negative  Suicidal Thoughts:  No  Homicidal Thoughts:  No  Memory:  Immediate;   Good Recent;   Good Remote;   Good  Judgement:  Poor  Insight:  Fair  Psychomotor Activity:  Normal  Concentration:  Good  Recall:  Good  Fund of Knowledge:Good  Language: Good  Akathisia:  No  Handed:  Right  AIMS (if  indicated):     Assets:  Communication Skills Desire for Improvement Housing Intimacy Leisure Time Physical Health Resilience Social Support Talents/Skills Transportation Vocational/Educational  Sleep:     Cognition: WNL  ADL's:  Intact     COGNITIVE FEATURES THAT CONTRIBUTE TO RISK:  None    SUICIDE RISK:   Severe:  Frequent, intense, and enduring suicidal ideation, specific plan, no subjective intent, but some objective markers of intent (i.e., choice of lethal method), the method is accessible, some limited preparatory behavior, evidence of impaired self-control, severe dysphoria/symptomatology, multiple risk factors present, and few if any protective factors, particularly a lack of social support.  PLAN OF CARE:  Pt attempted suicide by OD. States it was an impulsive act and now regrets it. She was found by her family and rushed to the ED. Pt was in the ICU and once stabilized [facility]. Today denies depression, SI/HI. Pt declined treatment with antidepressant today and is asking for d/c on Monday.  Will monitor and have daily contact Encouraged to continue daily groups and engaging with staff and peers  Medical Decision Making:  Established Problem, Stable/Improving (1), Review of Psycho-Social Stressors (1) and Review or order clinical lab tests (1)  I certify that inpatient services furnished can reasonably be expected to improve the patient's condition.   Annistyn Depass 04/12/2015, 9:30 AM

## 2015-04-12 NOTE — [facility] Group Notes (Signed)
[facility] Group Notes:  (Clinical Social Work)  04/12/2015     1:15-2:15PM  Summary of Progress/Problems:   The main focus of today's process group was to discuss patient feelings about being in the hospital and how they can focus on the positive aspects of it.  Comparison and contrast was done of people's feelings and symptoms, and they provided ideas and supports to each other.  Amber Frey did not speak during group.  Type of Therapy:  Group Therapy - Process   Participation Level:  Active  Participation Quality:  Appropriate, Attentive and Sharing  Affect:  Anxious and Blunted  Cognitive:  Alert  Insight:  Limited  Engagement in Therapy:  Limited  Modes of Intervention:  Education, Motivational Interviewing  Amber MantleMareida Grossman-Orr, LCSW 04/12/2015, 3:16 PM

## 2015-04-12 NOTE — [facility] Group Notes (Signed)
[facility] Group Notes:  (Nursing/MHT/Case Management/Adjunct)  Date:  04/12/2015  Time:  9:53 AM  Type of Therapy:  Psychoeducational Skills  Participation Level:  Active  Participation Quality:  Appropriate  Affect:  Appropriate  Cognitive:  Appropriate  Insight:  Appropriate  Engagement in Group:  Engaged  Modes of Intervention:  Discussion  Summary of Progress/Problems: Pt did attend self inventory group.       Ferrah Panagopoulos Shanta 04/12/2015, 9:53 AM 

## 2015-04-12 NOTE — Progress Notes (Signed)
Amber Frey is seen OOB UAL on the unit tolerated well. She is shy-like. She is observed  Giggling and laughing with her room mate and the other patients. She is coy and shy when she introduces herself in group this morning.She admits to the group that she has ' learned my lesson".    A She has refused anti depressants, saying " I don't think I need it..Im not depressed all the time". She completes her daily assessment and on it she writes she denies SI and she rates her depression, anxiety and hopelessness " 0/0/0/", respectively.   R Safety is in place and poc cont.

## 2015-04-12 NOTE — Progress Notes (Signed)
Adult Psychoeducational Group Note  Date:  04/12/2015 Time:  9:26 PM  Group Topic/Focus:  Wrap-Up Group:   The focus of this group is to help patients review their daily goal of treatment and discuss progress on daily workbooks.  Participation Level:  Active  Participation Quality:  Appropriate and Attentive  Affect:  Appropriate  Cognitive:  Appropriate  Insight: Appropriate and Good  Engagement in Group:  Engaged  Modes of Intervention:  Education  Additional Comments:  Pt overall had a good day and she was excited to receive her glasses cause she mentioned having problems with her sight. Pt goal for tomorrow is to interact more and participate.   Amber FrederickKeshia Frey Amber Frey 04/12/2015, 9:26 PM

## 2015-04-12 NOTE — Progress Notes (Signed)
D: Pt was guarded in interaction. Pt observed interacting with her roommate. Pt had minimal interactions with others within the milieu. Pt attended the end of group. Pt actively participated in group. Pt denied any suicidal ideation. Pt reports having a good day.  A: Continued support and availability as needed was extended to this pt. Staff continue to monitor pt with q6715min checks.  R: No adverse drug reactions noted. Pt receptive to treatment. Pt remains safe at this time.

## 2015-04-12 NOTE — [facility] Group Notes (Signed)
[facility] Group Notes:  (Nursing/MHT/Case Management/Adjunct)  Date:  04/12/2015  Time:  11:41 AM  Type of Therapy:  Psychoeducational Skills  Participation Level:  Did Not Attend  Participation Quality:  Did Not Attend  Affect:  Did Not Attend  Cognitive:  Did Not Attend  Insight:  None  Engagement in Group:  Did Not Attend  Modes of Intervention:  Did Not Attend  Summary of Progress/Problems: Pt did not attend healthy coping skills group.   Jacquelyne BalintForrest, Skilynn Durney Shanta 04/12/2015, 11:41 AM

## 2015-04-13 ENCOUNTER — Encounter (HOSPITAL_COMMUNITY): Payer: Self-pay | Admitting: Nurse Practitioner

## 2015-04-13 LAB — CULTURE, BLOOD (ROUTINE X 2)
CULTURE: NO GROWTH
Culture: NO GROWTH

## 2015-04-13 NOTE — Progress Notes (Signed)
Adult Psychoeducational Group Note  Date:  04/13/2015 Time:  0900   Group Topic/Focus:  Orientation:   The focus of this group is to educate the patient on the purpose and policies of crisis stabilization and provide a format to answer questions about their admission.  The group details unit policies and expectations of patients while admitted.  Participation Level:  Active  Participation Quality:  Appropriate  Affect:  Appropriate  Cognitive:  Appropriate  Insight: Appropriate  Engagement in Group:  Engaged  Modes of Intervention:  Orientation  Additional Comments:    Demont Linford L 04/13/2015, 10:10 AM

## 2015-04-13 NOTE — Progress Notes (Signed)
[name] is seen out in the dayroom. She makes good eye contact. She is articulate. She introduces herslef in group this morning as a person that ' needs help' ( this is  The reason she says she's here).  She completes her daily assessment and on it she wrote she denies SI today and she rates her depression hopelessness and anxiety "0/  /0", respectively.    A She is given a cup of warm water, for complaints of her dry cough and sore throat ( 2' intubation) and she says this is helpful. She is anxious to know when she is discharged and this nurse encouraged pt to speak with practitioner regarding DC criterior  as well as length of stay.   R Safety is in place and poc contd.

## 2015-04-13 NOTE — Progress Notes (Signed)
Adult Psychoeducational Group Note  Date:  04/13/2015 Time:  9:04 PM  Group Topic/Focus:  Wrap-Up Group:   The focus of this group is to help patients review their daily goal of treatment and discuss progress on daily workbooks.  Participation Level:  Active  Participation Quality:  Appropriate and Attentive  Affect:  Appropriate  Cognitive:  Appropriate  Insight: Appropriate and Good  Engagement in Group:  Engaged  Modes of Intervention:  Education  Additional Comments:  Pt had a great day. Pt goal is to work on going home.   Amber Frey 04/13/2015, 9:04 PM

## 2015-04-13 NOTE — [facility] Group Notes (Signed)
[facility] Group Notes:  (Clinical Social Work)  04/13/2015  1:15-2:74M  Summary of Progress/Problems:   The main focus of today's process group was to   1)  discuss the importance of adding supports  2)  define health supports versus unhealthy supports  3)  identify the patient's current unhealthy supports and plan how to handle them  4)  Identify the patient's current healthy supports and plan what to add.  An emphasis was placed on using counselor, doctor, therapy groups, 12-step groups, and problem-specific support groups to expand supports.    The patient expressed full comprehension of the concepts presented, and agreed that there is a need to add more supports.  The patient stated she feels her family is supportive.  Type of Therapy:  Process Group with Motivational Interviewing  Participation Level:  Active  Participation Quality:  Attentive  Affect:  Appropriate  Cognitive:  Appropriate  Insight:  Engaged  Engagement in Therapy:  Engaged  Modes of Intervention:   Education, Support and Processing, Activity  Ambrose MantleMareida Grossman-Orr, LCSW 04/13/2015

## 2015-04-13 NOTE — Tx Team (Signed)
Initial Interdisciplinary Treatment Plan   PATIENT STRESSORS: Educational concerns Traumatic event   PATIENT STRENGTHS: Average or above average intelligence Capable of independent living General fund of knowledge Supportive family/friends   PROBLEM LIST: Problem List/Patient Goals Date to be addressed Date deferred Reason deferred Estimated date of resolution  Depression      Stress from school      Risk for self harm-overdose attempt            "I really don't need to be here"                               DISCHARGE CRITERIA:  Ability to meet basic life and health needs Improved stabilization in mood, thinking, and/or behavior Motivation to continue treatment in a less acute level of care Need for constant or close observation no longer present Verbal commitment to aftercare and medication compliance  PRELIMINARY DISCHARGE PLAN: Attend aftercare/continuing care group Outpatient therapy Return to previous living arrangement Return to previous work or school arrangements  PATIENT/FAMIILY INVOLVEMENT: This treatment plan has been presented to and reviewed with the patient, Amber Frey, and/or family member.  The patient and family have been given the opportunity to ask questions and make suggestions.  Jesus GeneraSpeagle, [place] [facility] 04/13/2015, 11:37 PM

## 2015-04-13 NOTE — Progress Notes (Signed)
D:Kalima states today was good. She was visited by a host of family members this evening. Depression 0/10 Anxiety 0/10. Denies SI/HI/AVH. She has complaints of throat irritation and productive cough from her previous intubation. Her mood is pleasant and she is very conversant. She attended group this evening and participated. A: Encouragement and support given. Medication administered for symptoms.  R: Continue to monitor for patient safety and medication effectiveness.

## 2015-04-13 NOTE — Progress Notes (Signed)
[name] is seen out in the milieu today...she is pleasant, articulate and makes good eye contact. She smiles quickly when this writer approaches her first thing this morning and she attends her groups as well. She maintains that she " does nto nee" to be here, that she is not depressed and she is ready to go home.   A She attends her groups and she is engaged in her recovery as evidenced by her participation in her groups and she completes her daily assessment sheet. On this she writes she denies her self inventory, she rates her depressin, hopelessness and anxiety " 0/ /0", respectively .   R She is working on Theatre stage managerlearning new coping skills and hoping for DC tomorrow.

## 2015-04-13 NOTE — Progress Notes (Signed)
[facility]  04/13/2015 9:57 AM Amber Frey  MRN:  [id number] Subjective:  I had a blast last night.  Pt states things with family are good and they came to visit her last night. Pt feels comfortable going home to family and is looking forward to d/c. She is no longer feeling concerned about disappointing them and states they are supportive.   Sleep is good except for lingering cough from the breathing tube. Energy and appetite are good. Pt denies depression. Denies anhedonia, isolation, crying spells, low motivation, poor hygiene, worthlessness and hopelessness. Denies SI/HI.  O: Pt seen and chart reviewed. Pt has been attending groups. No disruptive behaviors noted. Continues to deny depression and SI.      Principal Problem: MDD (major depressive disorder), single episode, severe , no psychosis (HCC) Diagnosis:   Patient Active Problem List   Diagnosis Date Noted  . MDD (major depressive disorder), single episode, severe , no psychosis (HCC) [F32.2] 04/12/2015  . Overdose on Tylenol [T39.1X4A] 04/08/2015  . Acetaminophen overdose [T39.1X4A]   . Metabolic acidosis [E87.2]   . Salicylate overdose [T39.091A]   . Suicide attempt (HCC) [T14.91]   . AKI (acute kidney injury) (HCC) [N17.9]   . Acute respiratory failure (HCC) [J96.00]   . Hypokalemia [E87.6]   . Altered mental status [R41.82]   . Altered level of consciousness [R40.4]   . UGI bleed [K92.2]   . Encounter for central line placement [Z45.2]    Total Time spent with patient: 20 minutes  Past Psychiatric History: see H&P  Past Medical History: History reviewed. No pertinent past medical history. History reviewed. No pertinent past surgical history. Family History: History reviewed. No pertinent family history. Family Psychiatric  History: pt denies Social History:  History  Alcohol Use No     History  Drug Use No    Social History   Social History  . Marital Status: Single    Spouse Name: N/A  .  Number of Children: N/A  . Years of Education: N/A   Social History Main Topics  . Smoking status: Never Smoker   . Smokeless tobacco: Never Used  . Alcohol Use: No  . Drug Use: No  . Sexual Activity: No   Other Topics Concern  . None   Social History Narrative   Additional Social History:                        See H&P Sleep: Good  Appetite:  Good  Current Medications: Current Facility-Administered Medications  Medication Dose Route Frequency Provider Last Rate Last Dose  . acetaminophen (TYLENOL) tablet 650 mg  650 mg Oral Q6H PRN Worthy FlankIjeoma E Nwaeze, NP      . alum & mag hydroxide-simeth (MAALOX/MYLANTA) 200-200-20 MG/5ML suspension 30 mL  30 mL Oral Q4H PRN Worthy FlankIjeoma E Nwaeze, NP      . ibuprofen (ADVIL,MOTRIN) tablet 600 mg  600 mg Oral Q6H PRN Court Joyharles E Kober, PA-C   600 mg at 04/12/15 2131  . magnesium hydroxide (MILK OF MAGNESIA) suspension 30 mL  30 mL Oral Daily PRN Worthy FlankIjeoma E Nwaeze, NP      . menthol-cetylpyridinium (CEPACOL) lozenge 3 mg  1 lozenge Oral PRN Court Joyharles E Kober, PA-C        Lab Results:  Results for orders placed or performed during the hospital encounter of 04/07/15 (from the past 48 hour(s))  Protime-INR     Status: None   Collection Time: 04/11/15 10:47 AM  Result Value Ref Range   Prothrombin Time 14.1 11.6 - 15.2 seconds   INR 1.07 0.00 - 1.49    Physical Findings: AIMS: Facial and Oral Movements Muscles of Facial Expression: None, normal Lips and Perioral Area: None, normal Jaw: None, normal Tongue: None, normal,Extremity Movements Upper (arms, wrists, hands, fingers): None, normal Lower (legs, knees, ankles, toes): None, normal, Trunk Movements Neck, shoulders, hips: None, normal, Overall Severity Severity of abnormal movements (highest score from questions above): None, normal Incapacitation due to abnormal movements: None, normal Patient's awareness of abnormal movements (rate only patient's report): No Awareness, Dental  Status Current problems with teeth and/or dentures?: No Does patient usually wear dentures?: No  CIWA:  CIWA-Ar Total: 0 COWS:  COWS Total Score: 1  Musculoskeletal: Strength & Muscle Tone: within normal limits Gait & Station: normal Patient leans: N/A  Psychiatric Specialty Exam: Review of Systems  Constitutional: Negative for fever and chills.  HENT: Positive for sore throat.   Respiratory: Positive for cough.   Cardiovascular: Negative for chest pain.  Musculoskeletal: Negative for back pain, joint pain and neck pain.  Skin: Negative for itching and rash.  Neurological: Negative for dizziness, seizures, loss of consciousness and headaches.  Psychiatric/Behavioral: Negative for depression, suicidal ideas, hallucinations and substance abuse. The patient is not nervous/anxious and does not have insomnia.     Blood pressure 108/64, pulse 96, temperature 97.5 F (36.4 C), temperature source Oral, resp. rate 16, height  (1.575 m), weight 48.081 kg (106 lb), SpO2 100 %.Body mass index is 19.38 kg/(m^2).  General Appearance: Casual  Eye Contact::  Good  Speech:  Clear and Coherent and Normal Rate  Volume:  Normal  Mood:  Euthymic  Affect:  Full Range  Thought Process:  Goal Directed  Orientation:  Full (Time, Place, and Person)  Thought Content:  Negative  Suicidal Thoughts:  No  Homicidal Thoughts:  No  Memory:  Immediate;   Good Recent;   Good Remote;   Good  Judgement:  Poor  Insight:  Good  Psychomotor Activity:  Normal  Concentration:  Good  Recall:  Good  Fund of Knowledge:Good  Language: Good  Akathisia:  No  Handed:  Right  AIMS (if indicated):     Assets:  Communication Skills Desire for Improvement Housing Leisure Time Physical Health Resilience Social Support Talents/Skills Transportation Vocational/Educational  ADL's:  Intact  Cognition: WNL  Sleep:  Number of Hours: 7.25   Treatment Plan Summary: Daily contact with patient to assess and  evaluate symptoms and progress in treatment Encouraged to continue daily groups and engaging with staff and peers Pt declined treatment with antidepressants Pt may be ready for d/c on Monday or Tuesday.   Oletta Darter 04/13/2015, 9:57 AM

## 2015-04-13 NOTE — B[facility] Counselor (Signed)
Adult Comprehensive Assessment  Patient ID: Amber Frey, female   DOB: July 28, 1995, 19 y.o.   MRN: [id number]  Information Source: Information source: Patient  Current Stressors:  Educational / Learning stressors: Denies stressors Employment / Job issues: Denies stressors Family Relationships: Denies Chief Technology Officer / Lack of resources (include bankruptcy): Denies stressors Housing / Lack of housing: Denies stressors Physical health (include injuries & life threatening diseases): Denies stressors Social relationships: Denies stressors Substance abuse: Denies stressors Bereavement / Loss: Denies stressors  Living/Environment/Situation:  Living Arrangements: Other relatives, Parent (Mother, brother, sister-in-law, niece) Living conditions (as described by patient or guardian): Good conditions, has her own room How long has patient lived in current situation?: 8 years What is atmosphere in current home: Comfortable  Family History:  Marital status: Single (Was in a relationship with a man for 4-5 months that she feels she should not have been in, family would not have approved.  Ended it day before attempt.) Does patient have children?: No  Childhood History:  By whom was/is the patient raised?: Mother Additional childhood history information: Is from [place] originally, has been in the [place] since 2009.  [place] Description of patient's relationship with caregiver when they were a child: Had a good relationship with mother.  Talked to father via phone. Patient's description of current relationship with people who raised him/her: Still has a good relationship with mother.  Still talks to father via phone. Does patient have siblings?: Yes Number of Siblings: 1 Description of patient's current relationship with siblings: Big brother, lives with him, good relationship Did patient suffer any verbal/emotional/physical/sexual abuse as a child?: No Did patient suffer from severe  childhood neglect?: No Has patient ever been sexually abused/assaulted/raped as an adolescent or adult?: No Was the patient ever a victim of a crime or a disaster?: No Witnessed domestic violence?: No Has patient been effected by domestic violence as an adult?: No  Education:  Highest grade of school patient has completed: [employer] in 2013 Currently a student?: Yes If yes, how has current illness impacted academic performance: No impact Name of school: Administrator, sports at [employer] person: Self How long has the patient attended?: 2 years Learning disability?: No  Employment/Work Situation:   Employment situation: Employed 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, theatre/television/film How long has patient been employed?: 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 Patient's job has been impacted by current illness: No What is the longest time patient has a held a job?: 4 years 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 Has patient ever been in the [employer]?: No Has patient ever served in combat?: No  Financial Resources:   Financial resources: Income from employment, Private insurance Does patient have a representative payee or guardian?: No  Alcohol/Substance Abuse:   What has been your use of drugs/alcohol within the last 12 months?: None Alcohol/Substance Abuse Treatment Hx: Denies past history Has alcohol/substance abuse ever caused legal problems?: No  Social Support System:   Conservation officer, nature Support System: Good Describe Community Support System: Family, friends Type of faith/religion: Ephriam Knuckles How does patient's faith help to cope with current illness?: Chief Operating Officer:   Leisure and Hobbies: Drawing, listening to music, driving  Strengths/Needs:   What things does the patient do well?: Drawing In what areas does patient struggle / problems for patient: Self-confidence, fear of  disappointing her family  Discharge Plan:   Does patient have access to transportation?: Yes  Will patient be returning to same living situation after discharge?: Yes Currently receiving community mental health services: No If no, would patient like referral for services when discharged?: Yes (What county?) ([place] - [facility]) Does patient have financial barriers related to discharge medications?: No (Not on any medications, but does not have insurance)  Summary/Recommendations:    Amber Frey is a 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  She is [place] and lives with mother, brother, sister-in-law, and niece, next door to aunt and grandparents.  She had been dating a man with children for about 4-5 months and the day before her suicide attempt broke it off, felt she had shamed her family so tried to kill herself.  She has now talked to them and feels supported, no longer worried or depressed and not on medications.  She is somewhat willing to talk to a counselor, [facility].  The patient would benefit from safety monitoring, medication evaluation, psychoeducation, group therapy, and discharge planning to link with ongoing resources. The patient [facility].  The Discharge Process and Patient Involvement form was reviewed, signed and placed in the paper chart. Suicide Prevention Education was reviewed thoroughly, and a brochure left with patient.  The patient refused/signed consent for SPE to be provided to [employer] Kelly Do [phone].  She chose this person because they are close and this is the only family member who speaks [place].   Amber Frey, Zollie Ellery Jo. 04/13/2015

## 2015-04-13 NOTE — Progress Notes (Signed)
Pt reports she is doing fine and hopes to go home tomorrow or Tuesday.  She denies SI/HI/AVH.  She states her family is supportive and will help her.  She does not feel she needs any medications at this time.  She says her cough is better.  She knows she has lozenges available if she needs it.  She says she has gone to groups and participated on the unit.  Support and encouragement offered.  Pt makes her needs known to staff.  Discharge plans are in process.  Safety maintained with q15 minute checks.

## 2015-04-14 DIAGNOSIS — F322 Major depressive disorder, single episode, severe without psychotic features: Principal | ICD-10-CM

## 2015-04-14 NOTE — [facility] Group Notes (Signed)
B[facility] LCSW Group Therapy  04/14/2015 1:15pm  Type of Therapy:  Group Therapy vercoming Obstacles  Participation Level:  Minimal  Participation Quality:  Reserved  Affect:  Appropriate  Cognitive:  Appropriate and Oriented  Insight:  Limited  Engagement in Therapy:  Limited  Modes of Intervention:  Discussion, Exploration, Problem-solving and Support  Description of Group:   In this group patients will be encouraged to explore what they see as obstacles to their own wellness and recovery. They will be guided to discuss their thoughts, feelings, and behaviors related to these obstacles. The group will process together ways to cope with barriers, with attention given to specific choices patients can make. Each patient will be challenged to identify changes they are motivated to make in order to overcome their obstacles. This group will be process-oriented, with patients participating in exploration of their own experiences as well as giving and receiving support and challenge from other group members.  Summary of Patient Progress: Pt participates minimally in group discussion, requiring prompted. Pt continues to be reserved even when prompted. She identified school as an obstacle but was unable to elaborate as how it presented as an obstacle or identify feelings related to this obstacle. Pt identified thinking more positively as an area she needs to change at discharge and describes a desire to think more positively about herself.   Therapeutic Modalities:   Cognitive Behavioral Therapy Solution Focused Therapy Motivational Interviewing Relapse Prevention Therapy   Chad CordialLauren Carter, LCSWA 04/14/2015 3:45 PM

## 2015-04-14 NOTE — [facility] Suicide Risk Assessment (Signed)
[facility] INPATIENT:  Family/Significant Other Suicide Prevention Education  Suicide Prevention Education:  Education Completed; Inez CatalinaKelly Do, Pt's aunt [phone], has been identified by the patient as the family member/significant other with whom the patient will be residing, and identified as the person(s) who will aid the patient in the event of a mental health crisis (suicidal ideations/suicide attempt).  With written consent from the patient, the family member/significant other has been provided the following suicide prevention education, prior to the and/or following the discharge of the patient.  The suicide prevention education provided includes the following:  Suicide risk factors  Suicide prevention and interventions  National Suicide Hotline telephone number  [facility] assessment telephone number  [facility] Emergency Assistance 911  [facility] and/or Residential Mobile Crisis Unit telephone number  Request made of family/significant other to:  Remove weapons (e.g., guns, rifles, knives), all items previously/currently identified as safety concern.    Remove drugs/medications (over-the-counter, prescriptions, illicit drugs), all items previously/currently identified as a safety concern.  The family member/significant other verbalizes understanding of the suicide prevention education information provided.  The family member/significant other agrees to remove the items of safety concern listed above.  Elaina HoopsCarter, Rafay Dahan M 04/14/2015, 3:44 PM   **Suicide education completed with interpreter via language line: TJ [phone]#[id number]

## 2015-04-14 NOTE — Progress Notes (Signed)
Per Pt, she prefers to schedule her own aftercare upon discharge.   Chad CordialLauren Carter, LCSWA Clinical Social Work [phone]

## 2015-04-14 NOTE — Progress Notes (Signed)
Patient ID: Amber Frey, female   DOB: 09/01/1995, 19 y.o.   MRN: [id number]  Pt currently presents with an appropriate affect and cooperative behavior. Per self inventory, pt rates depression, hopelessness and anxiety at a 0. Pt's daily goal is to "stay positive, be better than yesterday, looking forward to going home" and they intend to do so by "keep going to group, think positive, say nice things, follow the rule, laugh." Pt reports good sleep, a good appetite, normal energy and good concentration.   Pt provided with medications per providers orders. Pt's labs and vitals were monitored throughout the day. Pt supported emotionally and encouraged to express concerns and questions. Pt educated on medications.   Pt's safety ensured with 15 minute and environmental checks. Pt currently denies SI/HI and A/V hallucinations. Pt verbally agrees to seek staff if SI/HI or A/VH occurs and to consult with staff before acting on these thoughts. Pt reports to staff "Just want to tell them, thank you so much for their kind(ness) and care. Pt reports she is feeling more confident about DC knowing she has her families support. Will continue POC.

## 2015-04-14 NOTE — Progress Notes (Addendum)
Patient ID: Amber Frey, female   DOB: 18-Apr-1996, 19 y.o.   MRN: [id number] [facility] MD Progress Note  04/14/2015 2:52 PM Amber Frey  MRN:  [id number] Subjective:  Patient reports she is doing better, and states mood is now improved, which she attributes at least partially to her family members being supportive, understanding. At this time does not endorse medication side effects.  Objective : I have discussed case with treatment team and have met with patient. 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 States she had been involved in an affair with a married man, and was feeling deeply ashamed of this and in particular very worried about how her family would react to this when they found out. States that this was the motivation for her overdose. Denies any severe depression or prominent neuro-vegetative symptoms of depression prior .  As noted, patient reports improvement at this time and at present does not endorse significant or severe depression. Patient not currently interested in psychiatric medications. Not on any psychiatric medications at this time. No disruptive behaviors on unit, pleasant upon approach, denying any lingering self injurious ideations, has been going to groups, visible in day room.       Principal Problem: MDD (major depressive disorder), single episode, severe , no psychosis ([place]) Diagnosis:   Patient Active Problem List   Diagnosis Date Noted  . MDD (major depressive disorder), single episode, severe , no psychosis ([place]) [F32.2] 04/12/2015  . Overdose on Tylenol [T39.1X4A] 04/08/2015  . Acetaminophen overdose [T39.1X4A]   . Metabolic acidosis [F79.0]   . Salicylate overdose [W40.973Z]   . Suicide attempt ([place]) [T14.91]   . AKI (acute kidney injury) ([place]) [N17.9]   . Acute respiratory failure ([place]) [J96.00]   .  Hypokalemia [E87.6]   . Altered mental status [R41.82]   . Altered level of consciousness [R40.4]   . UGI bleed [K92.2]   . Encounter for central line placement [Z45.2]    Total Time spent with patient: 25 minutes   Past Psychiatric History: see H&P  Past Medical History: History reviewed. No pertinent past medical history. History reviewed. No pertinent past surgical history. Family History: History reviewed. No pertinent family history. Family Psychiatric  History: pt denies Social History:  History  Alcohol Use No     History  Drug Use No    Social History   Social History  . Marital Status: Single    Spouse Name: N/A  . Number of Children: N/A  . Years of Education: N/A   Social History Main Topics  . Smoking status: Never Smoker   . Smokeless tobacco: Never Used  . Alcohol Use: No  . Drug Use: No  . Sexual Activity: No   Other Topics Concern  . None   Social History Narrative   Additional Social History:  See H&P Sleep: Good  Appetite:  Good  Current Medications: Current Facility-Administered Medications  Medication Dose Route Frequency Provider Last Rate Last Dose  . acetaminophen (TYLENOL) tablet 650 mg  650 mg Oral Q6H PRN Harriet Butte, NP      . alum & mag hydroxide-simeth (MAALOX/MYLANTA) 200-200-20 MG/5ML suspension 30 mL  30 mL Oral Q4H PRN Harriet Butte, NP      . ibuprofen (ADVIL,MOTRIN) tablet 600 mg  600 mg Oral Q6H PRN Dara Hoyer, PA-C   600 mg at 04/12/15 2131  .  magnesium hydroxide (MILK OF MAGNESIA) suspension 30 mL  30 mL Oral Daily PRN Harriet Butte, NP      . menthol-cetylpyridinium (CEPACOL) lozenge 3 mg  1 lozenge Oral PRN Dara Hoyer, PA-C        Lab Results:  No results found for this or any previous visit (from the past 48 hour(s)).  Physical Findings: AIMS: Facial and Oral Movements Muscles of Facial Expression: None, normal Lips and Perioral Area: None, normal Jaw: None, normal Tongue: None, normal,Extremity  Movements Upper (arms, wrists, hands, fingers): None, normal Lower (legs, knees, ankles, toes): None, normal, Trunk Movements Neck, shoulders, hips: None, normal, Overall Severity Severity of abnormal movements (highest score from questions above): None, normal Incapacitation due to abnormal movements: None, normal Patient's awareness of abnormal movements (rate only patient's report): No Awareness, Dental Status Current problems with teeth and/or dentures?: No Does patient usually wear dentures?: No  CIWA:  CIWA-Ar Total: 0 COWS:  COWS Total Score: 1  Musculoskeletal: Strength & Muscle Tone: within normal limits Gait & Station: normal Patient leans: N/A  Psychiatric Specialty Exam: Review of Systems  Constitutional: Negative for fever and chills.  HENT: Positive for sore throat.   Respiratory: Positive for cough.   Cardiovascular: Negative for chest pain.  Musculoskeletal: Negative for back pain, joint pain and neck pain.  Skin: Negative for itching and rash.  Neurological: Negative for dizziness, seizures, loss of consciousness and headaches.  Psychiatric/Behavioral: Negative for depression, suicidal ideas, hallucinations and substance abuse. The patient is not nervous/anxious and does not have insomnia.     Blood pressure 108/52, pulse 77, temperature 98.2 F (36.8 C), temperature source Oral, resp. rate 16, height 5' 2"  (1.575 m), weight 106 lb (48.081 kg), SpO2 100 %.Body mass index is 19.38 kg/(m^2).  General Appearance: Casual  Eye Contact::  Good  Speech:  Clear and Coherent and Normal Rate  Volume:  Normal  Mood: denies depression, states mood "OK"  Affect:   Reactive, smiles often/ appropriately during session  Thought Process:  Goal Directed  Orientation:  Full (Time, Place, and Person)  Thought Content:   Denies hallucinations, no delusions , not internally preoccupied  Suicidal Thoughts:  No denies any self injurious or suicidal ideations   Homicidal Thoughts:  No   Memory:  Immediate;   Good Recent;   Good Remote;   Good  Judgement:  Other:  improving  Insight:  improving   Psychomotor Activity:  Normal- no agitation or restlessness   Concentration:  Good  Recall:  Good  Fund of Knowledge:Good  Language: Good  Akathisia:  No  Handed:  Right  AIMS (if indicated):     Assets:  Communication Skills Desire for Improvement Housing Leisure Time Physical Health Resilience Social Support Talents/Skills Transportation Vocational/Educational  ADL's:  Intact  Cognition: WNL  Sleep:  Number of Hours: 6.5  Assessment - patient denies any significant depression at this time, and presents euthymic. Does not endorse neuro-vegetative symptoms of depression at this time.  States her suicidal attempt was related to shame , embarrassment and fear of how family would react to finding out she had been in an affair. At this time states that because family was supportive, understanding, she has no depression, and denies any lingering thoughts of suicide or self harm.  Of note, patient is on no standing psychiatric medications at this time- has  States she is not interested in meds at this time Treatment Plan Summary: Daily contact with patient to assess and  evaluate symptoms and progress in treatment Encouraged to continue daily groups and milieu to work on coping skills and symptom reduction. As noted, patient has declined to being on antidepressant medication or standing psychiatric medications  Will follow- consider discharge soon as she continues to improve , stabilize. COBOS, FERNANDO 04/14/2015, 2:52 PM

## 2015-04-14 NOTE — [facility] Group Notes (Signed)
[facility] Note  04/14/2015 8:45 AM  Participation Quality: Alert, Appropriate and Oriented  Mood/Affect: Appropriate  Depression Rating: 0  Anxiety Rating: 0  Thoughts of Suicide: Pt denies SI/HI  Will you contract for safety? Yes  Current AVH: Pt denies  Plan for Discharge/Comments: Pt attended discharge planning group and actively participated in group. CSW discussed suicide prevention education with the group and encouraged them to discuss discharge planning and any relevant barriers. Pt identified no needs, reports feeling better and is wanting to set up her own follow-up.  Transportation Means: Pt reports access to transportation  Supports: No supports mentioned at this time  Chad CordialLauren Carter, LCSWA 04/14/2015 9:37 AM

## 2015-04-14 NOTE — B[facility] Group Notes (Signed)
Adult Psychoeducational Group Note  Date:  04/14/2015 Time:  10:34 PM  Group Topic/Focus:  Wrap-Up Group:   The focus of this group is to help patients review their daily goal of treatment and discuss progress on daily workbooks.  Participation Level:  Minimal  Participation Quality:  Appropriate  Affect:  Appropriate  Cognitive:  Appropriate  Insight: Good  Engagement in Group:  Limited  Modes of Intervention:  Discussion  Additional Comments:  [name] stated her day was good and she stayed positive. She also expressed she may discharge tomorrow or Wednesday.  Caroll RancherLindsay, Erice Ahles A 04/14/2015, 10:34 PM

## 2015-04-14 NOTE — Progress Notes (Signed)
Recreation Therapy Notes  Date: 11.07.2016 Time: 9:30am Location: [address] Dayroom  Group Topic: Stress Management  Goal Area(s) Addresses:  Patient will actively participate in stress management techniques presented during session.   Behavioral Response: Did not attend.   Makinsey Pepitone L Earl Losee, LRT/CTRS        Amali Uhls L 04/14/2015 10:29 AM 

## 2015-04-15 NOTE — Tx Team (Signed)
Interdisciplinary Treatment Plan Update (Adult) Date: 04/15/2015   Date: 04/15/2015 12:58 PM  Progress in Treatment:  Attending groups: Yes  Participating in groups: Yes  Taking medication as prescribed: Yes  Tolerating medication: Yes  Family/Significant othe contact made: Yes, with aunt Patient understands diagnosis: Yes Discussing patient identified problems/goals with staff: Yes  Medical problems stabilized or resolved: Yes  Denies suicidal/homicidal ideation: Yes Patient has not harmed self or Others: Yes   New problem(s) identified:   Discharge Plan or Barriers: Pt will return home, requests to schedule her own follow-up  Additional comments: n/a   Reason for Continuation of Hospitalization:  Depression Medication stabilization Suicidal ideation  Estimated length of stay: 0 days; Pt stable for DC today  Review of initial/current patient goals per problem list:   1.  Goal(s): Patient will participate in aftercare plan  Met:  Yes  Target date: 3-5 days from date of admission   As evidenced by: Patient will participate within aftercare plan AEB aftercare provider and housing plan at discharge being identified.   04/15/15: Pt to return home, requests to schedule her own follow-up  2.  Goal (s): Patient will exhibit decreased depressive symptoms and suicidal ideations.  Met:  Yes  Target date: 3-5 days from date of admission   As evidenced by: Patient will utilize self rating of depression at 3 or below and demonstrate decreased signs of depression or be deemed stable for discharge by MD.  04/15/15: Pt rates depression at 0/10; denies SI  Attendees:  Patient:    Family:    Physician: Dr. Parke Poisson, MD  04/15/2015 12:58 PM  Nursing: Lars Pinks, RN Case manager  04/15/2015 12:58 PM  Clinical Social Worker Norman Clay, MSW 04/15/2015 12:58 PM  Other: Jake Bathe Liasion 04/15/2015 12:58 PM  Clinical: Loletta Specter RN 04/15/2015 12:58 PM  Other: ,  RN Charge Nurse 04/15/2015 12:58 PM  Other:     Peri Maris, Latanya Presser MSW

## 2015-04-15 NOTE — Plan of Care (Signed)
Problem: Alteration in mood Goal: LTG-Pt's behavior demonstrates decreased signs of depression (Patient's behavior demonstrates decreased signs of depression to the point the patient is safe to return home and continue treatment in an outpatient setting)  Outcome: Progressing Patient is pleasant and cooperative on the unit this am, talking with peers and verbalizes improved mood today.

## 2015-04-15 NOTE — Plan of Care (Signed)
Problem: Alteration in mood Goal: LTG-Patient reports reduction in suicidal thoughts (Patient reports reduction in suicidal thoughts and is able to verbalize a safety plan for whenever patient is feeling suicidal)  Outcome: Progressing Patient reports no suicidal ideation currently and contracts for safety on the unit today.

## 2015-04-15 NOTE — Discharge Summary (Signed)
Physician Discharge Summary Note  Patient:  Amber Frey is an 19 y.o., female MRN:  [id number] DOB:  12-25-1995 Patient phone:  [phone] (home)  Patient address:   [address] [place] [address],  Total Time spent with patient: 45 minutes  Date of Admission:  04/11/2015 Date of Discharge: 04/15/2015  Reason for Admission:  Per inpatient H&P upon arrival to hospital: Patient seen and examined with physician's assistant. Please refer to his admission H&P. Patient is a 20 year old female status post intentional overdose as a suicide attempt. Serum lab testing shows elevation of both Tylenol and acetaminophen with a negative urine drug screen. Patient did have hematemesis in the emergency department with altered mental status and inability to protect her airway requiring endotracheal intubation. Per report the patient was last seen normal between 11 AM & 3 PM on 10/31. She was found unresponsive at 8:30 PM on 10/31. Bottles of aspirin, Sudafed, & Tylenol or found near her. At this time the patient has a significant metabolic acidosis from elevated lactic acid. Psychiatry consulted with her and she agreed to come inpatient voluntarily Principal Problem: MDD (major depressive disorder), single episode, severe , no psychosis (HCC) Discharge Diagnoses: Patient Active Problem List   Diagnosis Date Noted  . MDD (major depressive disorder), single episode, severe , no psychosis (HCC) [F32.2] 04/12/2015  . Overdose on Tylenol [T39.1X4A] 04/08/2015  . Acetaminophen overdose [T39.1X4A]   . Metabolic acidosis [E87.2]   . Salicylate overdose [T39.091A]   . Suicide attempt (HCC) [T14.91]   . AKI (acute kidney injury) (HCC) [N17.9]   . Acute respiratory failure (HCC) [J96.00]   . Hypokalemia [E87.6]   . Altered mental status [R41.82]   . Altered level of consciousness [R40.4]   . UGI bleed [K92.2]   . Encounter for central line placement [Z45.2]     Musculoskeletal: Strength & Muscle Tone:  within normal limits Gait & Station: normal Patient leans: N/A  Psychiatric Specialty Exam: SEE SRA BY MD Physical Exam  Nursing note and vitals reviewed. Constitutional: She is oriented to person, place, and time. She appears well-developed and well-nourished.  HENT:  Head: Normocephalic and atraumatic.  Neck: Normal range of motion.  Musculoskeletal: Normal range of motion.  Neurological: She is alert and oriented to person, place, and time.  Skin: Skin is warm and dry.  Psychiatric: She has a normal mood and affect. Her behavior is normal. Judgment and thought content normal.    Review of Systems  Psychiatric/Behavioral: Negative for suicidal ideas and hallucinations. Depression: stable. Nervous/anxious: stable.   All other systems reviewed and are negative.   Blood pressure 96/62, pulse 92, temperature 97.8 F (36.6 C), temperature source Oral, resp. rate 16, height  (1.575 m), weight 48.081 kg (106 lb), SpO2 100 %.Body mass index is 19.38 kg/(m^2).  Have you used any form of tobacco in the last 30 days? (Cigarettes, Smokeless Tobacco, Cigars, and/or Pipes): No  Has this patient used any form of tobacco in the last 30 days? (Cigarettes, Smokeless Tobacco, Cigars, and/or Pipes) No  Past Medical History: History reviewed. No pertinent past medical history. History reviewed. No pertinent past surgical history. Family History: History reviewed. No pertinent family history. Social History:  History  Alcohol Use No     History  Drug Use No    Social History   Social History  . Marital Status: Single    Spouse Name: N/A  . Number of Children: N/A  . Years of Education: N/A   Social History  Main Topics  . Smoking status: Never Smoker   . Smokeless tobacco: Never Used  . Alcohol Use: No  . Drug Use: No  . Sexual Activity: No   Other Topics Concern  . None   Social History Narrative    Past Psychiatric History: Hospitalizations:  Outpatient Care:  Substance  Abuse Care:  Self-Mutilation:  Suicidal Attempts:  Violent Behaviors:   Risk to Self: Is patient at risk for suicide?: No What has been your use of drugs/alcohol within the last 12 months?: None Risk to Others:  no Prior Inpatient Therapy:  yes Prior Outpatient Therapy:  yes  Level of Care:  OP  Hospital Course:  Amber Frey was admitted for MDD (major depressive disorder), single episode, severe , no psychosis (HCC) and crisis management. She was treated discharged with the medications listed below under Medication List.  Medical problems were identified and treated as needed.  Home medications were restarted as appropriate.  Improvement was monitored by observation and Amber Frey daily report of symptom reduction.  Emotional and mental status was monitored by daily self-inventory reports completed by Amber Frey and clinical staff.         Amber Frey was evaluated by the treatment team for stability and plans for continued recovery upon discharge.  Amber Frey motivation was an integral factor for scheduling further treatment.  Employment, transportation, bed availability, health status, family support, and any pending legal issues were also considered during her hospital stay.  *She was offered further treatment options upon discharge including but not limited to Residential, Intensive Outpatient, and Outpatient treatment.  Amber Frey will follow up with the services as listed below under Follow Up Information.     Upon completion of this admission the patient was both mentally and medically stable for discharge denying suicidal/homicidal ideation, auditory/visual/tactile hallucinations, delusional thoughts and paranoia.      Consults:  psychiatry  Significant Diagnostic Studies:  labs: BMP;BUN<5, Calcium 8.4(L),Potassium 3.2 PT/INR 21.8/1.9 EToh/UDS-1  Discharge Vitals:   Blood pressure 96/62, pulse 92, temperature 97.8 F (36.6 C), temperature source Oral, resp. rate 16, height 5\' 2"   (1.575 m), weight 48.081 kg (106 lb), SpO2 100 %. Body mass index is 19.38 kg/(m^2). Lab Results:   No results found for this or any previous visit (from the past 72 hour(s)).  Physical Findings: AIMS: Facial and Oral Movements Muscles of Facial Expression: None, normal Lips and Perioral Area: None, normal Jaw: None, normal Tongue: None, normal,Extremity Movements Upper (arms, wrists, hands, fingers): None, normal Lower (legs, knees, ankles, toes): None, normal, Trunk Movements Neck, shoulders, hips: None, normal, Overall Severity Severity of abnormal movements (highest score from questions above): None, normal Incapacitation due to abnormal movements: None, normal Patient's awareness of abnormal movements (rate only patient's report): No Awareness, Dental Status Current problems with teeth and/or dentures?: No Does patient usually wear dentures?: No  CIWA:  CIWA-Ar Total: 0 COWS:  COWS Total Score: 1   See Psychiatric Specialty Exam and Suicide Risk Assessment completed by Attending Physician prior to discharge.  Discharge destination:  Home  Is patient on multiple antipsychotic therapies at discharge:  No   Has Patient had three or more failed trials of antipsychotic monotherapy by history:  No    Recommended Plan for Multiple Antipsychotic Therapies: NA      Discharge Instructions    Activity as tolerated - No restrictions    Complete by:  As directed      Diet general    Complete by:  As directed      Discharge instructions    Complete by:  As directed   Patient has been instructed to take medications as prescribed; and report adverse effects to outpatient provider.  Follow up with primary doctor for any medical issues and If symptoms recur report to nearest emergency or crisis hot line.            Medication List    STOP taking these medications        amoxicillin-clavulanate 875-125 MG tablet  Commonly known as:  AUGMENTIN     dextromethorphan-guaiFENesin  30-600 MG 12hr tablet  Commonly known as:  MUCINEX DM     ibuprofen 200 MG tablet  Commonly known as:  ADVIL,MOTRIN     pantoprazole 40 MG tablet  Commonly known as:  PROTONIX      TAKE these medications      Indication   norgestimate-ethinyl estradiol 0.25-35 MG-MCG tablet  Commonly known as:  ORTHO-CYCLEN,SPRINTEC,PREVIFEM  Take 1 tablet by mouth daily.        Follow-up Information    Follow up with Pt declines referral for outpatient services.      Follow up with Make appt with Primary Care Provider or [facility]. Schedule an appointment as soon as possible for a visit in 1 week.   Why:  LAB/Medications   Contact information:   [address] [place] [address] [phone]      Follow-up recommendations:  Activity:  as tolerated Diet:  heart healthy Tests:  CBC, CMP, A1C,PT/INR Other:  Make Appt with PCP  Comments:  Take all of you medications as prescribed by your mental healthcare provider.  Report any adverse effects and reactions from your medications to your outpatient provider promptly. Do not engage in alcohol and or illegal drug use while on prescription medicines. In the event of worsening symptoms call the crisis hotline, 911, and or go to the nearest emergency department for appropriate evaluation and treatment of symptoms. Follow-up with your primary care provider for your medical issues, concerns and or health care needs.   Keep all scheduled appointments.  If you are unable to keep an appointment call to reschedule.  Let the nurse know if you will need medications before next scheduled appointment.  Total Discharge Time:  Greater than 30 minutes  Signed: Oneta Rack FNP- [facility] 04/15/2015, 3:25 PM  Patient seen, Suicide Assessment Completed.  Disposition Plan Reviewed

## 2015-04-15 NOTE — [facility] Group Notes (Signed)
[facility] Group Notes:  (Nursing/MHT/Case Management/Adjunct)  Date:  04/15/2015  Time:  9:32 AM   Type of Therapy:  Nurse Education  SMART goal setting  Participation Level:  Active  Participation Quality:  Appropriate  Affect:  Appropriate  Cognitive:  Appropriate  Insight:  Appropriate  Engagement in Group:  Engaged  Modes of Intervention:  Discussion and Exploration  Summary of Progress/Problems:  Patient was engaged in group this morning stating that her goal for today was to "stay positive and go home."  Patient states her future goal is to finish school and spend time with her family.   Moshe SalisburyJennifer L Addison Whidbee 04/15/2015, 9:32 AM

## 2015-04-15 NOTE — [facility] Suicide Risk Assessment (Signed)
[facility] Discharge Suicide Risk Assessment   Demographic Factors:  19 year old single female, lives with her family ( mother, siblings), in college   Total Time spent with patient: 30 minutes  Musculoskeletal: Strength & Muscle Tone: within normal limits Gait & Station: normal Patient leans: N/A  Psychiatric Specialty Exam: Physical Exam  ROS  Blood pressure 96/62, pulse 92, temperature 97.8 F (36.6 C), temperature source Oral, resp. rate 16, height  (1.575 m), weight 106 lb (48.081 kg), SpO2 100 %.Body mass index is 19.38 kg/(m^2).  General Appearance: Well Groomed  Patent attorney::  improved   Speech:  Normal Rate409  Volume:  Normal  Mood:  Euthymic- at this time does not endorse depression or sadness, and presents euthymic   Affect:  Appropriate and Full Range  Thought Process:  Linear  Orientation:  Full (Time, Place, and Person)  Thought Content:  denies hallucinations, no delusions , not internally preoccupied   Suicidal Thoughts:  No- denies any suicidal ideations, no self injurious ideations  Homicidal Thoughts:  No  Memory:  recent and remote grossly intact   Judgement:  Other:  improving   Insight:  improving   Psychomotor Activity:  Normal  Concentration:  Good  Recall:  Good  Fund of Knowledge:Good  Language: Good  Akathisia:  Negative  Handed:  Right  AIMS (if indicated):     Assets:  Communication Skills Housing Resilience Social Support  Sleep:  Number of Hours: 6.5  Cognition: WNL  ADL's:  Intact   Have you used any form of tobacco in the last 30 days? (Cigarettes, Smokeless Tobacco, Cigars, and/or Pipes): No  Has this patient used any form of tobacco in the last 30 days? (Cigarettes, Smokeless Tobacco, Cigars, and/or Pipes) No  Mental Status Per Nursing Assessment::   On Admission:     Current Mental Status by Physician: At this time patient is much improved compared to her admission- she presents calm, euthymic, denies any depression, affect is  bright and smiles often and appropriately, no thought disorder, no suicidal ideations, no psychotic symptoms, future oriented.   Loss Factors: Patient had been involved in a relationship with a married man, and was very concerned about how her family would react .  Historical Factors:  Patient denies any prior psychiatric history or any prior history of suicidal /self injurious attempts   Risk Reduction Factors:   Sense of responsibility to family, Living with another person, especially a relative, Positive social support and Positive coping skills or problem solving skills  Continued Clinical Symptoms:  At this time patient is much improved and presents euthymic, with a full range of affect,  No SI , no HI, future oriented . Patient denies any lingering depression or neuro-vegetative symptoms of depression and states that once she realized her family was understanding, supportive, and not critical, her depression , anxiety completely resolved   Cognitive Features That Contribute To Risk:  No gross cognitive deficits noted upon discharge. Is alert , attentive, and oriented x 3   Suicide Risk:  Mild:  Suicidal ideation of limited frequency, intensity, duration, and specificity.  There are no identifiable plans, no associated intent, mild dysphoria and related symptoms, good self-control (both objective and subjective assessment), few other risk factors, and identifiable protective factors, including available and accessible social support.  Principal Problem: MDD (major depressive disorder), single episode, severe , no psychosis (HCC) Discharge Diagnoses:  Patient Active Problem List   Diagnosis Date Noted  . MDD (major depressive disorder),  single episode, severe , no psychosis (HCC) [F32.2] 04/12/2015  . Overdose on Tylenol [T39.1X4A] 04/08/2015  . Acetaminophen overdose [T39.1X4A]   . Metabolic acidosis [E87.2]   . Salicylate overdose [T39.091A]   . Suicide attempt (HCC) [T14.91]   .  AKI (acute kidney injury) (HCC) [N17.9]   . Acute respiratory failure (HCC) [J96.00]   . Hypokalemia [E87.6]   . Altered mental status [R41.82]   . Altered level of consciousness [R40.4]   . UGI bleed [K92.2]   . Encounter for central line placement [Z45.2]     Follow-up Information    Follow up with Pt declines referral for outpatient services.      Follow up with Make appt with Primary Care Provider or [facility]. Schedule an appointment as soon as possible for a visit in 1 week.   Why:  LAB/Medications   Contact information:   [address] [place] [id number] [phone]      Plan Of Care/Follow-up recommendations:  Activity:  as tolerated  Diet:  regular Tests:  NA Other:  see below  Is patient on multiple antipsychotic therapies at discharge:  No   Has Patient had three or more failed trials of antipsychotic monotherapy by history:  No  Recommended Plan for Multiple Antipsychotic Therapies: NA  Patient is leaving in good spirits . Plans to return home. Encouraged to get  outpatient psychotherapy. Offered PHP , but not interested in this level of care at this time    Hamdan Toscano 04/15/2015, 12:01 PM

## 2015-04-15 NOTE — Progress Notes (Signed)
  [facility] :  Will you be returning to the same living situation after discharge:  Yes,  Pt will return home with family At discharge, do you have transportation home?: Yes,  Pt's aunt to provide transportation Do you have the ability to pay for your medications: Yes,  Pt provided with prescriptions  Release of information consent forms completed and in the chart;  Patient's signature needed at discharge.  Patient to Follow up at: Follow-up Information    Follow up with Pt declines referral for outpatient services.      Follow up with Make appt with Primary Care Provider or [facility]. Schedule an appointment as soon as possible for a visit in 1 week.   Why:  LAB/Medications   Contact information:   [address] [place] [phone]      Next level of care provider has access to [facility] Link:no  Patient denies SI/HI: Yes,  Pt denies    Safety Planning and Suicide Prevention discussed: Yes,  with Aunt; see SPE note for further details  Have you used any form of tobacco in the last 30 days? (Cigarettes, Smokeless Tobacco, Cigars, and/or Pipes): No  Has patient been referred to the Quitline?: N/A patient is not a smoker  Elaina HoopsCarter, Costantino Kohlbeck M 04/15/2015, 1:07 PM

## 2015-04-15 NOTE — Progress Notes (Signed)
Patient verbalizes readiness for discharge. Follow up plan explained and strongly encouraged patient to follow up with a PCP/[facility] as indicated within the week. Patient verbalizes understanding. No Rx's needed, no belongings to return. She denies SI/HI and assures this Clinical research associatewriter she will seek assistance should that change. Patient discharged ambulatory and in stable condition to family. Lawrence MarseillesFriedman, Derrica Sieg Eakes

## 2015-04-28 ENCOUNTER — Ambulatory Visit: Payer: BLUE CROSS/BLUE SHIELD | Attending: Family Medicine | Admitting: Family Medicine

## 2015-04-28 ENCOUNTER — Encounter (HOSPITAL_BASED_OUTPATIENT_CLINIC_OR_DEPARTMENT_OTHER): Payer: BLUE CROSS/BLUE SHIELD | Admitting: Clinical

## 2015-04-28 ENCOUNTER — Encounter: Payer: Self-pay | Admitting: Family Medicine

## 2015-04-28 VITALS — BP 90/58 | HR 64 | Temp 97.9°F | Resp 14 | Ht 62.0 in | Wt 105.0 lb

## 2015-04-28 DIAGNOSIS — T1491 Suicide attempt: Secondary | ICD-10-CM

## 2015-04-28 DIAGNOSIS — Z79899 Other long term (current) drug therapy: Secondary | ICD-10-CM | POA: Insufficient documentation

## 2015-04-28 DIAGNOSIS — T1491XA Suicide attempt, initial encounter: Secondary | ICD-10-CM

## 2015-04-28 DIAGNOSIS — R42 Dizziness and giddiness: Secondary | ICD-10-CM | POA: Diagnosis not present

## 2015-04-28 DIAGNOSIS — F329 Major depressive disorder, single episode, unspecified: Secondary | ICD-10-CM | POA: Diagnosis not present

## 2015-04-28 DIAGNOSIS — E872 Acidosis, unspecified: Secondary | ICD-10-CM

## 2015-04-28 DIAGNOSIS — Z9189 Other specified personal risk factors, not elsewhere classified: Secondary | ICD-10-CM

## 2015-04-28 DIAGNOSIS — Z9151 Personal history of suicidal behavior: Secondary | ICD-10-CM

## 2015-04-28 DIAGNOSIS — Z915 Personal history of self-harm: Secondary | ICD-10-CM

## 2015-04-28 LAB — COMPREHENSIVE METABOLIC PANEL
ALK PHOS: 74 U/L (ref 33–115)
ALT: 43 U/L — AB (ref 6–29)
AST: 24 U/L (ref 10–30)
Albumin: 4.6 g/dL (ref 3.6–5.1)
BILIRUBIN TOTAL: 0.8 mg/dL (ref 0.2–1.2)
BUN: 14 mg/dL (ref 7–25)
CO2: 26 mmol/L (ref 20–31)
CREATININE: 0.62 mg/dL (ref 0.50–1.10)
Calcium: 10.5 mg/dL — ABNORMAL HIGH (ref 8.6–10.2)
Chloride: 101 mmol/L (ref 98–110)
GLUCOSE: 80 mg/dL (ref 65–99)
Potassium: 4.6 mmol/L (ref 3.5–5.3)
SODIUM: 140 mmol/L (ref 135–146)
TOTAL PROTEIN: 7.5 g/dL (ref 6.1–8.1)

## 2015-04-28 LAB — CBC WITH DIFFERENTIAL/PLATELET
BASOS ABS: 0 10*3/uL (ref 0.0–0.1)
Basophils Relative: 1 % (ref 0–1)
EOS ABS: 0.2 10*3/uL (ref 0.0–0.7)
EOS PCT: 4 % (ref 0–5)
HCT: 42.3 % (ref 36.0–46.0)
Hemoglobin: 14.3 g/dL (ref 12.0–15.0)
Lymphocytes Relative: 54 % — ABNORMAL HIGH (ref 12–46)
Lymphs Abs: 2.1 10*3/uL (ref 0.7–4.0)
MCH: 30 pg (ref 26.0–34.0)
MCHC: 33.8 g/dL (ref 30.0–36.0)
MCV: 88.7 fL (ref 78.0–100.0)
MPV: 9.3 fL (ref 8.6–12.4)
Monocytes Absolute: 0.3 10*3/uL (ref 0.1–1.0)
Monocytes Relative: 7 % (ref 3–12)
NEUTROS PCT: 34 % — AB (ref 43–77)
Neutro Abs: 1.3 10*3/uL — ABNORMAL LOW (ref 1.7–7.7)
PLATELETS: 463 10*3/uL — AB (ref 150–400)
RBC: 4.77 MIL/uL (ref 3.87–5.11)
RDW: 13.5 % (ref 11.5–15.5)
WBC: 3.9 10*3/uL — AB (ref 4.0–10.5)

## 2015-04-28 NOTE — Progress Notes (Signed)
ASSESSMENT: Pt currently denying current suicidal ideation, after a recent history of suicide attempt. Pt needs to f/u with PCP; may benefit from psychoeducation and supportive counseling regarding history of suicide attempt.  Stage of Change: precontemplative  PLAN: 1. F/U with behavioral health consultant at next PCP visit 2. Psychiatric Medications: none 3. Behavioral recommendation(s):   -Consider reading educational materials regarding coping with symptoms of depression -Consider counseling provided at school [facility] [employer]) and/[facility]  SUBJECTIVE: Pt. referred by Dr Venetia NightAmao for f/u post-suicide attempt:  Pt. reports the following symptoms/concerns: Pt states that she felt suicidal in the week before the suicidal attempt, but has never felt suicidal ideation previously. Pt denies any current suicidal ideation, says that her family and friends have been supportive of her after the attempt, that she has no financial difficulties, that there are no issues she needs to talk about today, and that she will set up an appointment with counselors at her school [facility] [facility]) if she feels like she needs to talk to someone.  Duration of problem: about a month Severity: severe  OBJECTIVE: Orientation & Cognition: Oriented x3. Thought processes normal and appropriate to situation. Mood: appropriate. Affect: appropriate Appearance: appropriate Risk of harm to self or others: no known risk of harm to self today, no known risk of harm to others Substance use: no Assessments administered: PHQ9: 0/ GAD7: 0  Diagnosis: History of suicide attempt CPT Code: Z91.89 -------------------------------------------- Other(s) present in the room: none  Time spent with patient in exam room: 16 minutes

## 2015-04-28 NOTE — Progress Notes (Signed)
CC: Follow-up from hospitalization.  HPI: Amber Frey is a 19 y.o. female who was recently hospitalized from 04/08/15-04/15/15 for suicidal attempt status post intentional overdose with Tylenol and aspirin.  On arrival in the ED she had hematemesis, altered mental status and was intubated to protect her airway.Her initial tylenol level was 217 and her salicylate level was 45.5. She was admitted to the intensive care unit and required intubation for two days and vasopressors support.She did have metabolic acidosis from lactic acidosis with a bicarbonate of 7 and anion gap of 23. She was given N-acetylcysteine per protocol and emergent hemodialysis x 4 hours which improved her mentation. She was also started on NaHCO3 infusion and given fomepizole at the request of poison control due to her concerning degree of metabolic acidosis at admission. She had a transaminitis that resolved and her INR remained stable. She was extubated on 11/2 and completed her an acetylcysteine the same day at which time both her tylenol and ASA levels were undetectable.  She was transferred to behavioral health on 04/11/15 where she was managed for major depressive disorder. She was placed on PPI due to hematemesis likely secondary to gastritis; placed on Augmentin for aspiration pneumonia and her condition stabilizied after which she was discharged.  Interval history: Today she reports doing well and denies any depression or suicidal ideation. She informs me she attempted suicide at that time due to the fact that she had no one to talk to also discuss her "issues" with. Complains of feeling a bit dizzy on standing up on would like to have her iron checked  Patient has No headache, No chest pain, No abdominal pain - No Nausea, No new weakness tingling or numbness, No Cough - SOB.  No Known Allergies History reviewed. No pertinent past medical history. Current Outpatient Prescriptions on File Prior to Visit  Medication  Sig Dispense Refill  . norgestimate-ethinyl estradiol (ORTHO-CYCLEN,SPRINTEC,PREVIFEM) 0.25-35 MG-MCG tablet Take 1 tablet by mouth daily.     No current facility-administered medications on file prior to visit.   History reviewed. No pertinent family history. Social History   Social History  . Marital Status: Single    Spouse Name: N/A  . Number of Children: N/A  . Years of Education: N/A   Occupational History  . Not on file.   Social History Main Topics  . Smoking status: Never Smoker   . Smokeless tobacco: Never Used  . Alcohol Use: No  . Drug Use: No  . Sexual Activity: No   Other Topics Concern  . Not on file   Social History Narrative    Review of Systems: Constitutional: Negative for fever, chills, diaphoresis, activity change, appetite change and fatigue. HENT: Negative for ear pain, nosebleeds, congestion, facial swelling, rhinorrhea, neck pain, neck stiffness and ear discharge.  Eyes: Negative for pain, discharge, redness, itching and visual disturbance. Respiratory: Negative for cough, choking, chest tightness, shortness of breath, wheezing and stridor.  Cardiovascular: Negative for chest pain, palpitations and leg swelling. Gastrointestinal: Negative for abdominal distention. Genitourinary: Negative for dysuria, urgency, frequency, hematuria, flank pain, decreased urine volume, difficulty urinating and dyspareunia.  Musculoskeletal: Negative for back pain, joint swelling, arthralgias and gait problem. Neurological: Negative for dizziness, tremors, seizures, syncope, facial asymmetry, speech difficulty, weakness, light-headedness, numbness and headaches.  Hematological: Negative for adenopathy. Does not bruise/bleed easily. Psychiatric/Behavioral: Negative for hallucinations, behavioral problems, confusion, dysphoric mood, decreased concentration and agitation.    Objective: Filed Vitals:   04/28/15 0905  BP: 90/58  Pulse:  64  Temp: 97.9 F (36.6 C)    TempSrc: Oral  Resp: 14  Height: 5\' 2"  (1.575 m)  Weight: 105 lb (47.628 kg)  SpO2: 99%      Physical Exam: Constitutional: Patient appears well-developed and well-nourished. No distress. HENT: Normocephalic, atraumatic, External right and left ear normal. Oropharynx is clear and moist.  Eyes: Conjunctivae and EOM are normal. PERRLA, no scleral icterus. Neck: Normal ROM. Neck supple. No JVD. No tracheal deviation. No thyromegaly. CVS: RRR, S1/S2 +, no murmurs, no gallops, no carotid bruit.  Pulmonary: Effort and breath sounds normal, no stridor, rhonchi, wheezes, rales.  Abdominal: Soft. BS +,  no distension, tenderness, rebound or guarding.  Musculoskeletal: Normal range of motion. No edema and no tenderness.  Lymphadenopathy: No lymphadenopathy noted, cervical, inguinal or axillary Neuro: Alert. Normal reflexes, muscle tone coordination. No cranial nerve deficit, +occassional dizziness Skin: Skin is warm and dry. No rash noted. Not diaphoretic. No erythema. No pallor. Psychiatric: Normal mood and affect. Behavior, judgment, thought content normal.  Lab Results  Component Value Date   WBC 8.2 04/10/2015   HGB 10.2* 04/10/2015   HCT 31.5* 04/10/2015   MCV 89.5 04/10/2015   PLT 126* 04/10/2015   Lab Results  Component Value Date   CREATININE 0.60 04/11/2015   BUN <5* 04/11/2015   NA 141 04/11/2015   K 4.3 04/11/2015   CL 108 04/11/2015   CO2 26 04/11/2015    No results found for: HGBA1C Lipid Panel     Component Value Date/Time   TRIG 73 04/10/2015 0857       Assessment and plan:  Suicidal attempt: LCSW called in for counseling session with the patient She denies any suicidal ideations or intent at this time and denies any underlying depression. I have explained to her that counseling sessions will be available to her with the LCSW. Have also offered a prescription for an SSRI which she refuses at this time.  Dizziness: I am sending off a CBC.  Metabolic  acidosis: Complete metabolic panel sent to assess for this.    Jaclyn ShaggyEnobong, Amao, MD. [facility] [phone] 04/28/2015, 9:11 AM

## 2015-04-28 NOTE — Progress Notes (Signed)
Pt's here for hospital f/up for suicide attempt. Pt reports feeling good today.  Pt's requesting to est.care with PCP.

## 2015-04-30 ENCOUNTER — Telehealth: Payer: Self-pay

## 2015-04-30 ENCOUNTER — Telehealth: Payer: Self-pay | Admitting: General Practice

## 2015-04-30 NOTE — Telephone Encounter (Signed)
CMA called patient, patient did not answer. CMA left a message for the patient to call me asap.

## 2015-04-30 NOTE — Telephone Encounter (Signed)
Patient called and requested her blood work results, please f/u with pt.  °

## 2015-05-01 ENCOUNTER — Emergency Department (HOSPITAL_COMMUNITY)
Admission: EM | Admit: 2015-05-01 | Discharge: 2015-05-01 | Disposition: A | Discharge status: Home / Self Care | Payer: BLUE CROSS/BLUE SHIELD | Attending: Emergency Medicine | Admitting: Emergency Medicine

## 2015-05-01 ENCOUNTER — Encounter (HOSPITAL_COMMUNITY): Payer: Self-pay | Admitting: *Deleted

## 2015-05-01 DIAGNOSIS — Z79899 Other long term (current) drug therapy: Secondary | ICD-10-CM | POA: Insufficient documentation

## 2015-05-01 DIAGNOSIS — F10120 Alcohol abuse with intoxication, uncomplicated: Secondary | ICD-10-CM | POA: Insufficient documentation

## 2015-05-01 DIAGNOSIS — R064 Hyperventilation: Secondary | ICD-10-CM | POA: Insufficient documentation

## 2015-05-01 DIAGNOSIS — R451 Restlessness and agitation: Secondary | ICD-10-CM | POA: Insufficient documentation

## 2015-05-01 DIAGNOSIS — F10920 Alcohol use, unspecified with intoxication, uncomplicated: Secondary | ICD-10-CM

## 2015-05-01 DIAGNOSIS — F419 Anxiety disorder, unspecified: Secondary | ICD-10-CM | POA: Insufficient documentation

## 2015-05-01 DIAGNOSIS — F10129 Alcohol abuse with intoxication, unspecified: Secondary | ICD-10-CM | POA: Diagnosis present

## 2015-05-01 DIAGNOSIS — Z793 Long term (current) use of hormonal contraceptives: Secondary | ICD-10-CM | POA: Diagnosis not present

## 2015-05-01 DIAGNOSIS — R0789 Other chest pain: Secondary | ICD-10-CM | POA: Diagnosis not present

## 2015-05-01 DIAGNOSIS — R0602 Shortness of breath: Secondary | ICD-10-CM | POA: Diagnosis not present

## 2015-05-01 DIAGNOSIS — F410 Panic disorder [episodic paroxysmal anxiety] without agoraphobia: Secondary | ICD-10-CM

## 2015-05-01 HISTORY — DX: Poisoning by unspecified drugs, medicaments and biological substances, intentional self-harm, initial encounter: T50.902A

## 2015-05-01 LAB — CBC WITH DIFFERENTIAL/PLATELET
Basophils Absolute: 0 10*3/uL (ref 0.0–0.1)
Basophils Relative: 0 %
Eosinophils Absolute: 0.1 10*3/uL (ref 0.0–0.7)
Eosinophils Relative: 2 %
HEMATOCRIT: 35.5 % — AB (ref 36.0–46.0)
Hemoglobin: 11.3 g/dL — ABNORMAL LOW (ref 12.0–15.0)
LYMPHS ABS: 2.3 10*3/uL (ref 0.7–4.0)
LYMPHS PCT: 45 %
MCH: 29 pg (ref 26.0–34.0)
MCHC: 31.8 g/dL (ref 30.0–36.0)
MCV: 91.3 fL (ref 78.0–100.0)
MONO ABS: 0.3 10*3/uL (ref 0.1–1.0)
MONOS PCT: 6 %
NEUTROS ABS: 2.4 10*3/uL (ref 1.7–7.7)
Neutrophils Relative %: 47 %
Platelets: 393 10*3/uL (ref 150–400)
RBC: 3.89 MIL/uL (ref 3.87–5.11)
RDW: 13.6 % (ref 11.5–15.5)
WBC: 5.2 10*3/uL (ref 4.0–10.5)

## 2015-05-01 LAB — ACETAMINOPHEN LEVEL

## 2015-05-01 LAB — COMPREHENSIVE METABOLIC PANEL
ALT: 34 U/L (ref 14–54)
ANION GAP: 10 (ref 5–15)
AST: 25 U/L (ref 15–41)
Albumin: 4 g/dL (ref 3.5–5.0)
Alkaline Phosphatase: 55 U/L (ref 38–126)
BILIRUBIN TOTAL: 0.5 mg/dL (ref 0.3–1.2)
BUN: 9 mg/dL (ref 6–20)
CALCIUM: 8.2 mg/dL — AB (ref 8.9–10.3)
CO2: 21 mmol/L — ABNORMAL LOW (ref 22–32)
Chloride: 106 mmol/L (ref 101–111)
Creatinine, Ser: 0.55 mg/dL (ref 0.44–1.00)
GFR calc Af Amer: >60 mL/min (ref >60)
Glucose, Bld: 103 mg/dL — ABNORMAL HIGH (ref 65–99)
POTASSIUM: 3.2 mmol/L — AB (ref 3.5–5.1)
Sodium: 137 mmol/L (ref 135–145)
TOTAL PROTEIN: 6.6 g/dL (ref 6.5–8.1)

## 2015-05-01 LAB — SALICYLATE LEVEL: Salicylate Lvl: <4 mg/dL (ref 2.8–30.0)

## 2015-05-01 LAB — ETHANOL: ALCOHOL ETHYL (B): 177 mg/dL — AB (ref <5)

## 2015-05-01 NOTE — Discharge Instructions (Signed)
Alcohol Intoxication °Alcohol intoxication occurs when the amount of alcohol that a person has consumed impairs his or her ability to mentally and physically function. Alcohol directly impairs the normal chemical activity of the brain. Drinking large amounts of alcohol can lead to changes in mental function and behavior, and it can cause many physical effects that can be harmful.  °Alcohol intoxication can range in severity from mild to very severe. Various factors can affect the level of intoxication that occurs, such as the person's age, gender, weight, frequency of alcohol consumption, and the presence of other medical conditions (such as diabetes, seizures, or heart conditions). Dangerous levels of alcohol intoxication may occur when people drink large amounts of alcohol in a short period (binge drinking). Alcohol can also be especially dangerous when combined with certain prescription medicines or "recreational" drugs. °SIGNS AND SYMPTOMS °Some common signs and symptoms of mild alcohol intoxication include: °· Loss of coordination. °· Changes in mood and behavior. °· Impaired judgment. °· Slurred speech. °As alcohol intoxication progresses to more severe levels, other signs and symptoms will appear. These may include: °· Vomiting. °· Confusion and impaired memory. °· Slowed breathing. °· Seizures. °· Loss of consciousness. °DIAGNOSIS  °Your health care provider will take a medical history and perform a physical exam. You will be asked about the amount and type of alcohol you have consumed. Blood tests will be done to measure the concentration of alcohol in your blood. In many places, your blood alcohol level must be lower than 80 mg/dL (0.08%) to legally drive. However, many dangerous effects of alcohol can occur at much lower levels.  °TREATMENT  °People with alcohol intoxication often do not require treatment. Most of the effects of alcohol intoxication are temporary, and they go away as the alcohol naturally  leaves the body. Your health care provider will monitor your condition until you are stable enough to go home. Fluids are sometimes given through an IV access tube to help prevent dehydration.  °HOME CARE INSTRUCTIONS °· Do not drive after drinking alcohol. °· Stay hydrated. Drink enough water and fluids to keep your urine clear or pale yellow. Avoid caffeine.   °· Only take over-the-counter or prescription medicines as directed by your health care provider.   °SEEK MEDICAL CARE IF:  °· You have persistent vomiting.   °· You do not feel better after a few days. °· You have frequent alcohol intoxication. Your health care provider can help determine if you should see a substance use treatment counselor. °SEEK IMMEDIATE MEDICAL CARE IF:  °· You become shaky or tremble when you try to stop drinking.   °· You shake uncontrollably (seizure).   °· You throw up (vomit) blood. This may be bright red or may look like black coffee grounds.   °· You have blood in your stool. This may be bright red or may appear as a black, tarry, bad smelling stool.   °· You become lightheaded or faint.   °MAKE SURE YOU:  °· Understand these instructions. °· Will watch your condition. °· Will get help right away if you are not doing well or get worse. °  °This information is not intended to replace advice given to you by your health care provider. Make sure you discuss any questions you have with your health care provider. °  °Document Released: 03/03/2005 Document Revised: 01/24/2013 Document Reviewed: 10/27/2012 °Elsevier Interactive Patient Education ©2016 Elsevier Inc. ° ° °Emergency Department Resource Guide °1) Find a Doctor and Pay Out of Pocket °Although you won't have to find out   who is covered by your insurance plan, it is a good idea to ask around and get recommendations. You will then need to call the office and see if the doctor you have chosen will accept you as a new patient and what types of options they offer for patients who  are self-pay. Some doctors offer discounts or will set up payment plans for their patients who do not have insurance, but you will need to ask so you aren't surprised when you get to your appointment. ° °2) Contact Your Local Health Department °Not all health departments have doctors that can see patients for sick visits, but many do, so it is worth a call to see if yours does. If you don't know where your local health department is, you can check in your phone book. The CDC also has a tool to help you locate your state's health department, and many state websites also have listings of all of their local health departments. ° °3) Find a Walk-in Clinic °If your illness is not likely to be very severe or complicated, you may want to try a walk in clinic. These are popping up all over the country in pharmacies, drugstores, and shopping centers. They're usually staffed by nurse practitioners or physician assistants that have been trained to treat common illnesses and complaints. They're usually fairly quick and inexpensive. However, if you have serious medical issues or chronic medical problems, these are probably not your best option. ° °No Primary Care Doctor: °- Call [facility] Connect at  832-8000 - they can help you locate a primary care doctor that  accepts your insurance, provides certain services, etc. °- Physician Referral Service- 1-800-533-3463 ° °Chronic Pain Problems: °Organization         Address  Phone   Notes  °[facility]  (336) 297-2271 Patients need to be referred by their primary care doctor.  ° °Medication Assistance: °Organization         Address  Phone   Notes  °[place] [facility] [address] °[place], [place] [address] (336) 641-8030 --Must be a resident of [place] °-- Must have NO insurance coverage whatsoever (no Medicaid/ Medicare, etc.) °-- The pt. MUST have a primary care doctor that directs their care regularly and follows  them in the community °  °[facility]  (866) 331-1348   °[employer]  (888) 892-1162   ° °Agencies that provide inexpensive medical care: °Organization         Address  Phone   Notes  °[facility]  (336) 832-8035   °[facility]    (336) 832-7272   °[facility] [address] °[place], [place] [address] (336) 832-4777   °[facility] [address], °[place] (336) 271-4999   °[facility]    (336) 373-0678   °[facility]    (336) 272-1050   °[facility] ° [address], [place] Phone:  (336) 832-4444, Fax:  (336) 832-4440 Hours of Operation:  9 am - 6 pm, M-F.  Also accepts Medicaid/Medicare and self-pay.  °[facility] ° [address],  Phone: (336) 832-3150, Fax: (336) 832-3151. Hours of Operation:  8:30 am - 5:30 pm, M-F.  Also accepts Medicaid and self-pay.  °[facility] [place] [address], [place] Phone: (336) 878-6027   °[facility] [address], [place], [place] (  336)723-1848, Ext. 123 Mondays & Thursdays: 7-9 AM.  First 15 patients are seen on a first come, first serve basis. °  ° °Medicaid-accepting [place] Providers: ° °Organization         Address  Phone   Notes  °[facility] [address], [place] (336) 641-2100 Also accepts self-pay patients.  °[facility] [address], [place] ° (336) 856-9996   °[facility] [address], [place] (336) 288-8857   °[facility] [address] [place] [address], [place] (336) 299-7000   °Veita Bland [address], [place]  ° (336) 373-1557 Only accepts [place] Access Medicaid patients after they have their name applied to their card.  ° °Self-Pay (no insurance) in [place]: ° °Organization         Address  Phone   Notes  °Sickle Cell  Patients, [facility] [address], [place] (336) 832-1970   °[facility] [address], [place] (336) 832-4400   °[facility] ° [address], [place] (336) 992-4800   °[facility]/Dr. Osei-Bonsu ° [address] [place] [address], [place] or [address], [place] (336) 841-8500 Phone number for both [place] and [place] locations is the same.  °[facility] [address], [place] (336) 299-0000   °[facility] [address] [place] [address], [place] or [address] (336) 852-7530 °(336) 878-2260   °[facility] [address], [place] (336) 350-1642, phone; (336) 294-5005, fax Sees patients 1st and 3rd Saturday of every month.  Must not qualify for public or private insurance (i.e. Medicaid, Medicare, [place] Health Choice, Veterans' Benefits) • Household income should be no more than 200% of the poverty level •The clinic cannot treat you if you are pregnant or think you are pregnant • Sexually transmitted diseases are not treated at the clinic.  ° ° °Dental Care: °Organization         Address  Phone  Notes  °[facility] [address], [place] (336) 641-6152 Accepts children up to age 21 who are enrolled in Medicaid or [place] Health Choice; pregnant women with a Medicaid card; and children who have applied for Medicaid or [place] Health Choice, but were declined, whose parents can pay a reduced fee at time of service.  °[facility] [place]  [address], [place] (336) 641-7733 Accepts children up to age 21 who are enrolled in Medicaid or [place] Health Choice; pregnant women with a Medicaid card; and children who have applied for Medicaid or  Health Choice, but were declined, whose parents can pay a reduced fee at time of service.  °[facility] ° [address], [place] (336) 641-4533 Patients are seen by appointment only. Walk-ins are not accepted. [facility] will see patients 18 years of age and older. °Monday - Tuesday (8am-5pm) °Most Wednesdays (8:30-5pm) °$30 per visit, cash only  °[facility] ° [address], [place] (336) 641-4533 Patients are seen by appointment only. Walk-ins are not accepted. [facility] will see patients 18 years of age and older. °One Wednesday Evening (Monthly: Volunteer Based).  $30 per visit, cash only  °[facility]  (919)   537-3737 for adults; Children under age 4, call Graduate Pediatric Dentistry at (919) [phone]. Children aged 4-14, please call (919) [phone] to request a pediatric application. ° Dental services are provided in all areas of dental care including fillings, crowns and bridges, complete and partial dentures, implants, gum treatment, root canals, and extractions. Preventive care is also provided. Treatment is provided to both adults and children. °Patients are selected via a lottery and there is often a waiting list. °  °[facility] [address], °[place] ° (336) 763-8833 [email or web address] °  °[facility] [address], [place], [place] (336)723-1848, Ext. 123 Second and Fourth Thursday of each month, opens at 6:30 AM; Clinic ends at 9 AM.  Patients are seen on a first-come first-served basis, and a limited number are seen during each clinic.  ° °[facility] ° [address], [place], [place] (336) 723-7904   Eligibility Requirements °You must have lived in [place], [place], or [place] counties for at least the last three months. °  You cannot be eligible for state or federal sponsored healthcare insurance, including Veterans Administration, Medicaid, or Medicare. °  You generally cannot be eligible for healthcare insurance through your employer.  °  How to apply: °Eligibility  screenings are held every Tuesday and Wednesday afternoon from 1:00 pm until 4:00 pm. You do not need an appointment for the interview!  °[facility] [address], [place], [place] 336-631-2330   °[facility]  336-342-8273   °[facility]  336-703-3100   °[facility]  336-570-6415   ° °Behavioral Health Resources in the Community: °Intensive Outpatient Programs °Organization         Address  Phone  Notes  °[facility] [address], [place], [place] 336-878-6098   °[facility] [address], [place], [place] 336-832-9800   °[facility] [address], [place], [place] ° 336-882-2125   °[facility] [address],  °[place], [place] 1-800-853-5163 or 336-641-4981   °Substance Abuse Resources °Organization         Address  Phone  Notes  °[facility]  336-882-2125   °[facility]  336-784-9470   °[facility]  336-285-9073   °[facility]  336-845-3988   °Residential & Outpatient Substance Abuse Program  1-800-659-3381   °Psychological Services °Organization         Address  Phone  Notes  ° Health  336- 832-9600   °[facility]  336- 378-7881   °[facility] [address], [place] 1-800-853-5163 or 336-641-4981   ° °Mobile Crisis Teams °Organization         Address  Phone  Notes  °[facility], Mobile Crisis Care Unit  1-877-626-1772   °Assertive °[facility] ° [address] [place], [place] 336-834-9664   °Sharon DeEsch [address] °[place] 336-554-5454   ° °Self-Help/Support Groups °Organization         Address  Phone             Notes  °[facility]  - variety of support groups  336- 373-1402 Call for more information  °Narcotics Anonymous (NA), [facility] [address], °[place]  2 meetings at  this location  ° °Residential Treatment Programs °Organization         Address  Phone  Notes  °  [facility] [address],    °[place]  1-866-801-8205   °[facility] ° [address], [place], [place] 704-293-8524   °[facility] [address], [place] 336-845-3988 Admissions: 8am-3pm M-F  °[facility] [address]    °[place], [place] 336-841-1104   °[facility] [address], [place], [place] 336-379-7146   °[facility] [address]  °[place], [place] 336-285-9073   °[facility] Intensive Outpatient [address], [place], [place] 336-852-3033   °[facility] [address]  °[place], [place] 1-877-615-2722 or 336-784-9470   °[facility] ([facility] [address] [place], [place] 336-227-7417 Accepts Medicaid  °[facility] [address]  °[place] 1-800-659-3381 Substance Abuse/Addiction Treatment  ° °[place] Behavioral Health Resources °Organization         Address  Phone  Notes  °[facility]  (888) 581-9988   °Julie Brannon, PhD [address] [place], [place]   (336) 349-5553 or (336) 951-0000   °[facility]   [address] °[place], [place] (336) 349-4454   °[facility] [address], [place], [place] (336) 342-8316 Insurance/Medicaid/sponsorship through [facility]  °[facility] [address]                                    [place], [place] (336) 342-8316 Therapy/tele-psych/case  °[facility] [address]  ° [place], [place] (336) 349-2233    °Dr. Arfeen  (336) 349-4544   °[facility] [place]  [employer] [place] [facility] 1) [address], [place] °2) [address], [place] °3)  [address], [place] (336) 349-3220 °(336) 342-7768 ° °(336) 342-8140   °[place] [facility] (336) 342-1394 or (336)  342-3537 (After Hours)    ° ° ° °

## 2015-05-01 NOTE — ED Notes (Signed)
Patient brought in by EMS after being called for patient being intoxicated at a party.  Patient is alert and oriented x4.  She understands and answers appropriately on arrival to The ED.  Patient denies any SI or HI.  Currently patient denies any pain.

## 2015-05-01 NOTE — ED Notes (Signed)
Patient is alert and oriented x3.  She was given DC instructions and follow up visit instructions.  Patient gave verbal understanding. She was DC ambulatory under her own power to home.  V/S stable.  He was not showing any signs of distress on DC 

## 2015-05-01 NOTE — ED Notes (Signed)
Bed: WA06 Expected date:  Expected time:  Means of arrival:  Comments: Anxiety/etoh

## 2015-05-01 NOTE — ED Notes (Signed)
Family at bedside. 

## 2015-05-01 NOTE — ED Notes (Signed)
Requested a second nurse to attempt completion of triage. Spoke to charge nurse in reference to patient behaviors and the request for this nurse to leave the room.
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
## 2015-05-01 NOTE — ED Notes (Signed)
EKG given to EDP,Palumbo,MD., for review. 
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
## 2015-05-04 ENCOUNTER — Other Ambulatory Visit: Payer: Self-pay | Admitting: Physician Assistant

## 2015-05-04 NOTE — Telephone Encounter (Signed)
Patient returned call. She states that she wants to discontinue BC.

## 2015-05-04 NOTE — Telephone Encounter (Signed)
LM   Patient is due for a follow up for birthcontrol what is plan?

## 2015-05-05 ENCOUNTER — Telehealth: Payer: Self-pay

## 2015-05-05 NOTE — Telephone Encounter (Signed)
Patient returned my call and verified name and DOB. Pt was given lab results and asked about her iron levels. Patient has appointment scheduled for 12/19 where she can discuss with Provider then her concerns.

## 2015-05-05 NOTE — Telephone Encounter (Signed)
-----   Message from Jaclyn ShaggyEnobong Amao, MD sent at 04/29/2015  4:58 PM EST ----- Labs revealed mildly elevated liver enzymes from her overdose which has trended down compared to hospitalization. We will monitor.

## 2015-05-05 NOTE — Telephone Encounter (Signed)
Patient returned phone call, please f/u with pt.    °

## 2015-05-05 NOTE — Telephone Encounter (Signed)
CMA called patient, patient did not answer. I left a message for the patient to return my call once she receive my message. Pt was left a generic message with out details to the nature of my call.

## 2015-05-13 ENCOUNTER — Emergency Department (HOSPITAL_COMMUNITY): Payer: BLUE CROSS/BLUE SHIELD

## 2015-05-13 ENCOUNTER — Encounter (HOSPITAL_COMMUNITY): Payer: Self-pay | Admitting: Emergency Medicine

## 2015-05-13 ENCOUNTER — Emergency Department (HOSPITAL_COMMUNITY)
Admission: EM | Admit: 2015-05-13 | Discharge: 2015-05-13 | Disposition: A | Discharge status: Home / Self Care | Payer: BLUE CROSS/BLUE SHIELD | Attending: Emergency Medicine | Admitting: Emergency Medicine

## 2015-05-13 DIAGNOSIS — R42 Dizziness and giddiness: Secondary | ICD-10-CM | POA: Insufficient documentation

## 2015-05-13 DIAGNOSIS — Z3202 Encounter for pregnancy test, result negative: Secondary | ICD-10-CM | POA: Diagnosis not present

## 2015-05-13 DIAGNOSIS — R1030 Lower abdominal pain, unspecified: Secondary | ICD-10-CM | POA: Diagnosis present

## 2015-05-13 DIAGNOSIS — N390 Urinary tract infection, site not specified: Secondary | ICD-10-CM | POA: Insufficient documentation

## 2015-05-13 DIAGNOSIS — R197 Diarrhea, unspecified: Secondary | ICD-10-CM | POA: Diagnosis not present

## 2015-05-13 DIAGNOSIS — R8281 Pyuria: Secondary | ICD-10-CM

## 2015-05-13 DIAGNOSIS — R319 Hematuria, unspecified: Secondary | ICD-10-CM

## 2015-05-13 LAB — CBC WITH DIFFERENTIAL/PLATELET
BASOS PCT: 0 %
Basophils Absolute: 0 10*3/uL (ref 0.0–0.1)
EOS ABS: 0.1 10*3/uL (ref 0.0–0.7)
Eosinophils Relative: 1 %
HCT: 40.4 % (ref 36.0–46.0)
HEMOGLOBIN: 13.2 g/dL (ref 12.0–15.0)
Lymphocytes Relative: 17 %
Lymphs Abs: 2.5 10*3/uL (ref 0.7–4.0)
MCH: 29.9 pg (ref 26.0–34.0)
MCHC: 32.7 g/dL (ref 30.0–36.0)
MCV: 91.6 fL (ref 78.0–100.0)
MONOS PCT: 6 %
Monocytes Absolute: 0.9 10*3/uL (ref 0.1–1.0)
NEUTROS PCT: 76 %
Neutro Abs: 11.1 10*3/uL — ABNORMAL HIGH (ref 1.7–7.7)
Platelets: 252 10*3/uL (ref 150–400)
RBC: 4.41 MIL/uL (ref 3.87–5.11)
RDW: 13.4 % (ref 11.5–15.5)
WBC: 14.7 10*3/uL — AB (ref 4.0–10.5)

## 2015-05-13 LAB — URINALYSIS, ROUTINE W REFLEX MICROSCOPIC
Bilirubin Urine: NEGATIVE
Glucose, UA: NEGATIVE mg/dL
KETONES UR: NEGATIVE mg/dL
NITRITE: NEGATIVE
Specific Gravity, Urine: 1.027 (ref 1.005–1.030)
pH: 6.5 (ref 5.0–8.0)

## 2015-05-13 LAB — COMPREHENSIVE METABOLIC PANEL
ALBUMIN: 4.6 g/dL (ref 3.5–5.0)
ALK PHOS: 62 U/L (ref 38–126)
ALT: 22 U/L (ref 14–54)
ANION GAP: 10 (ref 5–15)
AST: 23 U/L (ref 15–41)
BUN: 11 mg/dL (ref 6–20)
CALCIUM: 10 mg/dL (ref 8.9–10.3)
CHLORIDE: 100 mmol/L — AB (ref 101–111)
CO2: 25 mmol/L (ref 22–32)
Creatinine, Ser: 0.65 mg/dL (ref 0.44–1.00)
GFR calc Af Amer: >60 mL/min (ref >60)
GFR calc non Af Amer: >60 mL/min (ref >60)
GLUCOSE: 100 mg/dL — AB (ref 65–99)
POTASSIUM: 3.5 mmol/L (ref 3.5–5.1)
SODIUM: 135 mmol/L (ref 135–145)
Total Bilirubin: 0.8 mg/dL (ref 0.3–1.2)
Total Protein: 7.7 g/dL (ref 6.5–8.1)

## 2015-05-13 LAB — URINE MICROSCOPIC-ADD ON
BACTERIA UA: NONE SEEN
SQUAMOUS EPITHELIAL / LPF: NONE SEEN

## 2015-05-13 LAB — ACETAMINOPHEN LEVEL: Acetaminophen (Tylenol), Serum: <10 ug/mL — ABNORMAL LOW (ref 10–30)

## 2015-05-13 LAB — PREGNANCY, URINE: Preg Test, Ur: NEGATIVE

## 2015-05-13 LAB — SALICYLATE LEVEL: Salicylate Lvl: <4 mg/dL (ref 2.8–30.0)

## 2015-05-13 MED ORDER — ONDANSETRON 8 MG PO TBDP
8.0000 mg | ORAL_TABLET | Freq: Once | ORAL | Status: AC
  Administered 2015-05-13: 8 mg via ORAL
  Filled 2015-05-13: qty 1

## 2015-05-13 MED ORDER — PHENAZOPYRIDINE HCL 200 MG PO TABS: 200.0000 mg | ORAL_TABLET | Freq: Three times a day (TID) | ORAL | Status: DC

## 2015-05-13 MED ORDER — IBUPROFEN 800 MG PO TABS
800.0000 mg | ORAL_TABLET | Freq: Once | ORAL | Status: AC
  Administered 2015-05-13: 800 mg via ORAL
  Filled 2015-05-13: qty 1

## 2015-05-13 MED ORDER — PHENAZOPYRIDINE HCL 200 MG PO TABS
200.0000 mg | ORAL_TABLET | Freq: Three times a day (TID) | ORAL | Status: AC
  Administered 2015-05-13: 200 mg via ORAL
  Filled 2015-05-13: qty 1

## 2015-05-13 MED ORDER — CEPHALEXIN 500 MG PO CAPS: 500.0000 mg | ORAL_CAPSULE | Freq: Four times a day (QID) | ORAL | Status: DC

## 2015-05-13 MED ORDER — IOHEXOL 300 MG/ML  SOLN
80.0000 mL | Freq: Once | INTRAMUSCULAR | Status: AC | PRN
  Administered 2015-05-13: 80 mL via INTRAVENOUS

## 2015-05-13 NOTE — ED Provider Notes (Signed)
CSN: [id number]     Arrival date & time 05/13/15  0226 History   First MD Initiated Contact with Patient 05/13/15 0235     Chief Complaint  Patient presents with  . Dysuria     (Consider location/radiation/quality/duration/timing/severity/associated sxs/prior Treatment) Patient is a 19 y.o. female presenting with dysuria.  Dysuria Pain quality:  Burning Pain severity:  Moderate Onset quality:  Gradual Duration:  7 hours Timing:  Constant Progression:  Worsening Chronicity:  New Recent urinary tract infections: no   Relieved by:  Nothing Ineffective treatments:  None tried Urinary symptoms: discolored urine and hematuria (subjective)   Urinary symptoms: no bladder incontinence   Associated symptoms: abdominal pain (suprapubic), nausea and vomiting (x1)   Associated symptoms: no fever and no vaginal discharge   Abdominal pain:    Location:  Suprapubic   Quality:  Burning   Severity:  Mild   Onset quality:  Gradual   Duration:  7 hours   Timing:  Constant   Progression:  Worsening   Chronicity:  New Nausea:    Severity:  Mild   Onset quality:  Gradual   Timing:  Intermittent   Progression:  Waxing and waning Vomiting:    Quality:  Stomach contents   Number of occurrences:  1   Severity:  Mild   Timing:  Rare   Progression:  Improving Risk factors: urinary catheter use (hx of catheter during hospitalization for overdose 1 month ago)   Risk factors: not pregnant, not sexually active (patient denies sexual activity) and no sexually transmitted infections     Past Medical History  Diagnosis Date  . Suicidal overdose (HCC)     04/07/2015   History reviewed. No pertinent past surgical history. No family history on file. Social History  Substance Use Topics  . Smoking status: Never Smoker   . Smokeless tobacco: Never Used  . Alcohol Use: Yes   OB History    No data available      Review of Systems  Constitutional: Negative for fever.  Gastrointestinal:  Positive for nausea, vomiting (x1), abdominal pain (suprapubic) and diarrhea (x2, loose, slightly watery).  Genitourinary: Positive for dysuria and hematuria (subjective). Negative for vaginal bleeding and vaginal discharge.  Musculoskeletal: Positive for myalgias (diffuse).  Neurological: Positive for light-headedness.  All other systems reviewed and are negative.   Allergies  Review of patient's allergies indicates no known allergies.  Home Medications   Prior to Admission medications   Not on File   BP 104/66 mmHg  Pulse 64  Temp(Src) 98.4 F (36.9 C) (Oral)  Resp 16  Ht 5\' 2"  (1.575 m)  Wt 47.628 kg  BMI 19.20 kg/m2  SpO2 98%  LMP 03/26/2015   Physical Exam  Constitutional: She is oriented to person, place, and time. She appears well-developed and well-nourished. No distress.  Nontoxic/nonseptic appearing. Patient drinking water without difficulty.  HENT:  Head: Normocephalic and atraumatic.  Eyes: Conjunctivae and EOM are normal. No scleral icterus.  Neck: Normal range of motion.  Cardiovascular: Normal rate and intact distal pulses.   Pulmonary/Chest: Effort normal and breath sounds normal. No respiratory distress. She has no wheezes. She has no rales.  Respirations even and unlabored  Abdominal: Soft. She exhibits no distension. There is tenderness. There is no rebound and no guarding.  Mild suprapubic tenderness. No masses or peritoneal signs. No involuntary or voluntary guarding.  Musculoskeletal: Normal range of motion.  Neurological: She is alert and oriented to person, place, and time. She exhibits normal  muscle tone. Coordination normal.  GCS 15. She is ambulatory independently with steady gait.  Skin: Skin is warm and dry. No rash noted. She is not diaphoretic. No erythema. No pallor.  Psychiatric: She has a normal mood and affect. Her behavior is normal.  Nursing note and vitals reviewed.   ED Course  Procedures (including critical care time) Labs  Review Labs Reviewed  URINALYSIS, ROUTINE W REFLEX MICROSCOPIC (NOT AT [facility]) - Abnormal; Notable for the following:    Color, Urine RED (*)    APPearance CLOUDY (*)    Hgb urine dipstick LARGE (*)    Protein, ur >300 (*)    Leukocytes, UA MODERATE (*)    All other components within normal limits  CBC WITH DIFFERENTIAL/PLATELET - Abnormal; Notable for the following:    WBC 14.7 (*)    Neutro Abs 11.1 (*)    All other components within normal limits  COMPREHENSIVE METABOLIC PANEL - Abnormal; Notable for the following:    Chloride 100 (*)    Glucose, Bld 100 (*)    All other components within normal limits  ACETAMINOPHEN LEVEL - Abnormal; Notable for the following:    Acetaminophen (Tylenol), Serum <10 (*)    All other components within normal limits  URINE CULTURE  PREGNANCY, URINE  SALICYLATE LEVEL  URINE MICROSCOPIC-ADD ON  GC/CHLAMYDIA PROBE AMP ([place]) NOT AT [facility]    Imaging Review No results found. I have personally reviewed and evaluated these images and lab results as part of my medical decision-making.   EKG Interpretation None      MDM   Final diagnoses:  Pyuria  Hematuria    Patient presenting for abdominal pain, hematuria, and dysuria. She has a hx of intentional overdose 1 month ago requiring intubation and ICU admission. She denies taking any medications PTA. Patient is afebrile. She does have a leukocytosis of 14.7 which may be due to infection as UA shows TNTC WBCs and RBCs. Likely hemorrhagic cystitis, but given abdominal pain, unable to r/o kidney stone. Patient has no hx of stones. Will obtain CT.  Patient signed out to [facility], PA-C at shift change with CT pending. If CT negative, anticipate d/c with Keflex and pyridium. Urine culture in process.   Filed Vitals:   05/13/15 0238 05/13/15 0417 05/13/15 0430 05/13/15 0500  BP: 110/71 116/72 108/71 104/66  Pulse: 86 71 56 64  Temp: 97.9 F (36.6 C) 98.4 F (36.9 C)    TempSrc: Oral Oral     Resp: Height:   (1.575 m)    Weight:  47.628 kg    SpO2: 100% 100% 100% 98%     Antony Madura, PA-C 05/14/15 2009  Shon Baton, MD 05/14/15 2302

## 2015-05-13 NOTE — ED Provider Notes (Signed)
[facility] care [facility]. Plan is for follow-up on CT abdomen. If no acute pathology, patient may be discharged home with antibiotic therapy/Pyridium and follow-up with PCP. CT abdomen shows no acute intra-abdominal pathology.  Discussed with patient plan for discharge and discharge medications. She verbalizes understanding and agrees with this plan. She is sleeping comfortably in the ED, hemodynamically stable, afebrile and is appropriate for discharge for outpatient follow-up.  Filed Vitals:   05/13/15 0500 05/13/15 0704  BP: 104/66 100/51  Pulse: 64 55  Temp:    Resp: 16 15   Meds given in ED:  Medications  ibuprofen (ADVIL,MOTRIN) tablet 800 mg (800 mg Oral Given 05/13/15 0417)  ondansetron (ZOFRAN-ODT) disintegrating tablet 8 mg (8 mg Oral Given 05/13/15 0414)  phenazopyridine (PYRIDIUM) tablet 200 mg (200 mg Oral Given 05/13/15 0519)  iohexol (OMNIPAQUE) 300 MG/ML solution 80 mL (80 mLs Intravenous Contrast Given 05/13/15 0604)    New Prescriptions   CEPHALEXIN (KEFLEX) 500 MG CAPSULE    Take 1 capsule (500 mg total) by mouth 4 (four) times daily.   PHENAZOPYRIDINE (PYRIDIUM) 200 MG TABLET    Take 1 tablet (200 mg total) by mouth 3 (three) times daily with meals.     Joycie PeekBenjamin Deatra Mcmahen, PA-C 05/13/15 [id number]  Shon Batonourtney F Horton, MD 05/13/15 [id number]

## 2015-05-13 NOTE — ED Notes (Signed)
Pt c/o dysuria onset 2100 and dysuria with hematuria onset 2300. +N/V/D, body aches

## 2015-05-13 NOTE — Discharge Instructions (Signed)
There does not appear to be an emergent cause for your symptoms at this time. Your CT scan of your abdomen did not show any acute problems or infections. You'll be discharged with antibiotics and Pyridium for your urinary symptoms. Please follow-up with your doctor in the next 2-3 days for reevaluation. Return to ED for any new or worsening symptoms including fevers, chills, abdominal pain  Urinary Tract Infection Urinary tract infections (UTIs) can develop anywhere along your urinary tract. Your urinary tract is your body's drainage system for removing wastes and extra water. Your urinary tract includes two kidneys, two ureters, a bladder, and a urethra. Your kidneys are a pair of bean-shaped organs. Each kidney is about the size of your fist. They are located below your ribs, one on each side of your spine. CAUSES Infections are caused by microbes, which are microscopic organisms, including fungi, viruses, and bacteria. These organisms are so small that they can only be seen through a microscope. Bacteria are the microbes that most commonly cause UTIs. SYMPTOMS  Symptoms of UTIs may vary by age and gender of the patient and by the location of the infection. Symptoms in young women typically include a frequent and intense urge to urinate and a painful, burning feeling in the bladder or urethra during urination. Older women and men are more likely to be tired, shaky, and weak and have muscle aches and abdominal pain. A fever may mean the infection is in your kidneys. Other symptoms of a kidney infection include pain in your back or sides below the ribs, nausea, and vomiting. DIAGNOSIS To diagnose a UTI, your caregiver will ask you about your symptoms. Your caregiver will also ask you to provide a urine sample. The urine sample will be tested for bacteria and white blood cells. White blood cells are made by your body to help fight infection. TREATMENT  Typically, UTIs can be treated with medication.  Because most UTIs are caused by a bacterial infection, they usually can be treated with the use of antibiotics. The choice of antibiotic and length of treatment depend on your symptoms and the type of bacteria causing your infection. HOME CARE INSTRUCTIONS  If you were prescribed antibiotics, take them exactly as your caregiver instructs you. Finish the medication even if you feel better after you have only taken some of the medication.  Drink enough water and fluids to keep your urine clear or pale yellow.  Avoid caffeine, tea, and carbonated beverages. They tend to irritate your bladder.  Empty your bladder often. Avoid holding urine for long periods of time.  Empty your bladder before and after sexual intercourse.  After a bowel movement, women should cleanse from front to back. Use each tissue only once. SEEK MEDICAL CARE IF:   You have back pain.  You develop a fever.  Your symptoms do not begin to resolve within 3 days. SEEK IMMEDIATE MEDICAL CARE IF:   You have severe back pain or lower abdominal pain.  You develop chills.  You have nausea or vomiting.  You have continued burning or discomfort with urination. MAKE SURE YOU:   Understand these instructions.  Will watch your condition.  Will get help right away if you are not doing well or get worse.   This information is not intended to replace advice given to you by your health care provider. Make sure you discuss any questions you have with your health care provider.   Document Released: 03/03/2005 Document Revised: 02/12/2015 Document Reviewed: 07/02/2011 Elsevier  Interactive Patient Education ©2016 [employer] ° °

## 2015-05-13 NOTE — ED Notes (Signed)
Writer was getting ready to draw blood work, pt states she was dizzy and was getting ready to pass out. Writer notified triage RN.

## 2015-05-14 LAB — URINE CULTURE

## 2015-05-26 ENCOUNTER — Ambulatory Visit: Payer: Self-pay | Admitting: Family Medicine

## 2015-06-02 ENCOUNTER — Ambulatory Visit (INDEPENDENT_AMBULATORY_CARE_PROVIDER_SITE_OTHER): Payer: BLUE CROSS/BLUE SHIELD | Admitting: Emergency Medicine

## 2015-06-02 VITALS — BP 104/68 | HR 70 | Temp 97.8°F | Resp 14 | Ht 62.5 in | Wt 108.2 lb

## 2015-06-02 DIAGNOSIS — J209 Acute bronchitis, unspecified: Secondary | ICD-10-CM | POA: Diagnosis not present

## 2015-06-02 MED ORDER — CLARITHROMYCIN 500 MG PO TABS: 500.0000 mg | ORAL_TABLET | Freq: Two times a day (BID) | ORAL | Status: DC

## 2015-06-02 MED ORDER — HYDROCOD POLST-CPM POLST ER 10-8 MG/5ML PO SUER: 5.0000 mL | Freq: Two times a day (BID) | ORAL | Status: DC

## 2015-06-02 NOTE — Patient Instructions (Signed)

## 2015-06-02 NOTE — Progress Notes (Signed)
Subjective:  Patient ID: Amber Frey, female    DOB: 11/15/1995  Age: 19 y.o. MRN: [id number]  CC: Cough; Headache; Chest Pain; Sore Throat; and Immunizations   HPI Amber Frey    patient has nasal congestion nasal discharge and a purulent postnasal drip. She has sore throat. She has a cough productive of purulent sputum. No wheezing or shortness of breath. She has no fever or chills. No change in her activity or appetite. She's been unable to control her symptoms with over-the-counter medication. She has no nausea vomiting or diarrhea. No rash.  History Amber Frey has a past medical history of Suicidal overdose (HCC).   She has no past surgical history on file.   Her  family history is not on file.  She   reports that she has never smoked. She has never used smokeless tobacco. She reports that she drinks alcohol. She reports that she does not use illicit drugs.  Outpatient Prescriptions Prior to Visit  Medication Sig Dispense Refill  . cephALEXin (KEFLEX) 500 MG capsule Take 1 capsule (500 mg total) by mouth 4 (four) times daily. (Patient not taking: Reported on 06/02/2015) 40 capsule 0  . phenazopyridine (PYRIDIUM) 200 MG tablet Take 1 tablet (200 mg total) by mouth 3 (three) times daily with meals. (Patient not taking: Reported on 06/02/2015) 20 tablet 0   No facility-administered medications prior to visit.    Social History   Social History  . Marital Status: Single    Spouse Name: N/A  . Number of Children: N/A  . Years of Education: N/A   Social History Main Topics  . Smoking status: Never Smoker   . Smokeless tobacco: Never Used  . Alcohol Use: Yes  . Drug Use: No  . Sexual Activity: No   Other Topics Concern  . None   Social History Narrative     Review of Systems  Constitutional: Negative for fever, chills and appetite change.  HENT: Positive for congestion, postnasal drip, rhinorrhea and sore throat. Negative for ear pain and sinus pressure.     Eyes: Negative for pain and redness.  Respiratory: Positive for cough. Negative for shortness of breath and wheezing.   Cardiovascular: Negative for leg swelling.  Gastrointestinal: Negative for nausea, vomiting, abdominal pain, diarrhea, constipation and blood in stool.  Endocrine: Negative for polyuria.  Genitourinary: Negative for dysuria, urgency, frequency and flank pain.  Musculoskeletal: Negative for gait problem.  Skin: Negative for rash.  Neurological: Negative for weakness and headaches.  Psychiatric/Behavioral: Negative for confusion and decreased concentration. The patient is not nervous/anxious.     Objective:  BP 104/68 mmHg  Pulse 70  Temp(Src) 97.8 F (36.6 C) (Oral)  Resp 14  Ht 5' 2.5" (1.588 m)  Wt 108 lb 3.2 oz (49.079 kg)  BMI 19.46 kg/m2  SpO2 95%  Physical Exam  Constitutional: She is oriented to person, place, and time. She appears well-developed and well-nourished. No distress.  HENT:  Head: Normocephalic and atraumatic.  Right Ear: External ear normal.  Left Ear: External ear normal.  Nose: Nose normal.  Eyes: Conjunctivae and EOM are normal. Pupils are equal, round, and reactive to light. No scleral icterus.  Neck: Normal range of motion. Neck supple. No tracheal deviation present.  Cardiovascular: Normal rate, regular rhythm and normal heart sounds.   Pulmonary/Chest: Effort normal. No respiratory distress. She has no wheezes. She has no rales.  Abdominal: She exhibits no mass. There is no tenderness. There is no rebound and no guarding.  Musculoskeletal: She exhibits no edema.  Lymphadenopathy:    She has no cervical adenopathy.  Neurological: She is alert and oriented to person, place, and time. Coordination normal.  Skin: Skin is warm and dry. No rash noted.  Psychiatric: She has a normal mood and affect. Her behavior is normal.      Assessment & Plan:   Amber Frey was seen today for cough, headache, chest pain, sore throat and  immunizations.  Diagnoses and all orders for this visit:  Acute bronchitis, unspecified organism  Other orders -     clarithromycin (BIAXIN) 500 MG tablet; Take 1 tablet (500 mg total) by mouth 2 (two) times daily. -     chlorpheniramine-HYDROcodone (TUSSIONEX PENNKINETIC ER) 10-8 MG/5ML SUER; Take 5 mLs by mouth 2 (two) times daily.  I am having Ms. Laveda Norman start on clarithromycin and chlorpheniramine-HYDROcodone. I am also having her maintain her cephALEXin and phenazopyridine.  Meds ordered this encounter  Medications  . clarithromycin (BIAXIN) 500 MG tablet    Sig: Take 1 tablet (500 mg total) by mouth 2 (two) times daily.    Dispense:  20 tablet    Refill:  0  . chlorpheniramine-HYDROcodone (TUSSIONEX PENNKINETIC ER) 10-8 MG/5ML SUER    Sig: Take 5 mLs by mouth 2 (two) times daily.    Dispense:  60 mL    Refill:  0    Appropriate red flag conditions were discussed with the patient as well as actions that should be taken.  Patient expressed his understanding.  Follow-up: Return if symptoms worsen or fail to improve.  Carmelina Dane, MD

## 2015-08-27 ENCOUNTER — Encounter: Payer: Self-pay | Admitting: Internal Medicine

## 2015-08-27 ENCOUNTER — Ambulatory Visit (INDEPENDENT_AMBULATORY_CARE_PROVIDER_SITE_OTHER): Payer: BLUE CROSS/BLUE SHIELD | Admitting: Internal Medicine

## 2015-08-27 VITALS — BP 117/67 | HR 71 | Temp 97.9°F | Ht 62.5 in | Wt 114.5 lb

## 2015-08-27 DIAGNOSIS — J3489 Other specified disorders of nose and nasal sinuses: Secondary | ICD-10-CM

## 2015-08-27 DIAGNOSIS — R067 Sneezing: Secondary | ICD-10-CM | POA: Diagnosis not present

## 2015-08-27 DIAGNOSIS — T1491XA Suicide attempt, initial encounter: Secondary | ICD-10-CM

## 2015-08-27 DIAGNOSIS — R0789 Other chest pain: Secondary | ICD-10-CM

## 2015-08-27 DIAGNOSIS — R42 Dizziness and giddiness: Secondary | ICD-10-CM | POA: Diagnosis not present

## 2015-08-27 DIAGNOSIS — R0982 Postnasal drip: Secondary | ICD-10-CM | POA: Diagnosis not present

## 2015-08-27 DIAGNOSIS — J302 Other seasonal allergic rhinitis: Secondary | ICD-10-CM | POA: Insufficient documentation

## 2015-08-27 DIAGNOSIS — Z915 Personal history of self-harm: Secondary | ICD-10-CM

## 2015-08-27 MED ORDER — FEXOFENADINE HCL 60 MG PO TABS: 60.0000 mg | ORAL_TABLET | Freq: Two times a day (BID) | ORAL | Status: DC

## 2015-08-27 NOTE — Progress Notes (Signed)
   Subjective:    Patient ID: Amber Frey, female    DOB: 01/10/1996, 20 y.o.   MRN: [id number]  HPI  Amber Frey is a 20 year old woman with a PMH of suicide attempt (ASA and APAP overdose leading to ICU and psychiatric hospitalization afterward). who comes to the clinic today to discuss feeling "sick" and to discuss mental health issues. Approximately, two days ago she report new onset light-headedness upon standing, chest discomfort on deep inspiration (5-6/10 pain). She denies any fever or sick contacts.   She lives with her grandmother and aunt. She goes to [employer] and studies Primary school teachergraphic design, which she enjoys "sorta." She has no plans to hurt herself after the suicide attempt last October. Nonetheless, she would like to speak with a counselor to discuss her mental health. She says she has significant anxiety, but she is worried specifically about family issues, which she does not feel like discussing. She feels safe at home. She gets along with her aunt and grandmother. She doesn't smoke, drink, or use illicit drugs.    Review of Systems  Constitutional: Positive for fatigue. Negative for fever and chills.  HENT: Positive for postnasal drip, rhinorrhea and sneezing. Negative for congestion, sinus pressure and sore throat.   Eyes: Positive for itching. Negative for redness.  Respiratory: Positive for chest tightness. Negative for cough, shortness of breath and wheezing.   Cardiovascular: Negative for chest pain, palpitations and leg swelling.  Gastrointestinal: Negative for abdominal pain.  Genitourinary: Negative for dysuria.  Musculoskeletal: Negative for myalgias.  Neurological: Positive for light-headedness. Negative for dizziness and syncope.  Psychiatric/Behavioral: Negative for suicidal ideas, confusion, self-injury, dysphoric mood and decreased concentration. The patient is nervous/anxious and is hyperactive.        Objective:   Physical Exam  Constitutional: She appears well-developed  and well-nourished.  HENT:  Mouth/Throat: Oropharynx is clear and moist. No oropharyngeal exudate.  Cardiovascular: Normal rate, regular rhythm and normal heart sounds.   Pulmonary/Chest: Effort normal and breath sounds normal. No respiratory distress. She has no wheezes.  Psychiatric: She has a normal mood and affect. Her behavior is normal.  Vitals reviewed.         Assessment & Plan:   Please see problem based assessment and plan for details.

## 2015-08-27 NOTE — Patient Instructions (Signed)
[name],  It was a pleasure seeing you today.  It sounds like you're having some bad allergies. Please take the fexofenadine (Allegra) twice per day. If you feel like you're getting a fever or that your symptoms are worsening, please make another appointment with the clinic. Take the Allegra whether you have symptoms or not.   It's good that you're seeking counseling services. We will have a Child psychotherapist contact you. If you feel you ever feel you may hurt yourself, go straight to the emergency room.  Allergies An allergy is an abnormal reaction to a substance by the body's defense system (immune system). Allergies can develop at any age. WHAT CAUSES ALLERGIES? An allergic reaction happens when the immune system mistakenly reacts to a normally harmless substance, called an allergen, as if it were harmful. The immune system releases antibodies to fight the substance. Antibodies eventually release a chemical called histamine into the bloodstream. The release of histamine is meant to protect the body from infection, but it also causes discomfort. An allergic reaction can be triggered by:  Eating an allergen.  Inhaling an allergen.  Touching an allergen. WHAT TYPES OF ALLERGIES ARE THERE? There are many types of allergies. Common types include:  Seasonal allergies. People with this type of allergy are usually allergic to substances that are only present during certain seasons, such as molds and pollens.  Food allergies.  Drug allergies.  Insect allergies.  Animal dander allergies. WHAT ARE SYMPTOMS OF ALLERGIES? Possible allergy symptoms include:  Swelling of the lips, face, tongue, mouth, or throat.  Sneezing, coughing, or wheezing.  Nasal congestion.  Tingling in the mouth.  Rash.  Itching.  Itchy, red, swollen areas of skin (hives).  Watery eyes.  Vomiting.  Diarrhea.  Dizziness.  Lightheadedness.  Fainting.  Trouble breathing or swallowing.  Chest  tightness.  Rapid heartbeat. HOW ARE ALLERGIES DIAGNOSED? Allergies are diagnosed with a medical and family history and one or more of the following:  Skin tests.  Blood tests.  A food diary. A food diary is a record of all the foods and drinks you have in a day and of all the symptoms you experience.  The results of an elimination diet. An elimination diet involves eliminating foods from your diet and then adding them back in one by one to find out if a certain food causes an allergic reaction. HOW ARE ALLERGIES TREATED? There is no cure for allergies, but allergic reactions can be treated with medicine. Severe reactions usually need to be treated at a hospital. HOW CAN REACTIONS BE PREVENTED? The best way to prevent an allergic reaction is by avoiding the substance you are allergic to. Allergy shots and medicines can also help prevent reactions in some cases. People with severe allergic reactions may be able to prevent a life-threatening reaction called anaphylaxis with a medicine given right after exposure to the allergen.   This information is not intended to replace advice given to you by your health care provider. Make sure you discuss any questions you have with your health care provider.   Document Released: 08/17/2002 Document Revised: 06/14/2014 Document Reviewed: 03/05/2014 Elsevier Interactive Patient Education 2016 Elsevier Inc.  B?nh s?t ma c? kh (Hay Fever) B?nh s?t ma c? kh l ph?n ?ng d? ?ng v?i cc h?t trong khng kh. B?nh khng ly t? ng??i sang ng??i. Khng th? ch?a kh?i h?n b?nh ny nh?ng c th? ki?m sot ???c.  NGUYN NHN B?nh s?t ma c? kh gy b?i ci g ?  lm kh?i pht ph?n ?ng d? ?ng (d? nguyn). Sau ?y l nh?ng v d? v? cc d? nguyn:  C? ph?n h??ng.  Lng.  Gu c?a ??ng v?t.  C? v ph?n hoa.  Khi thu?c l.  B?i trong nh.   nhi?m. TRI?U CH?NG  H?t h?i.  Ch?y n??c m?i ho?c ng?t m?i.  Ch?y n??c m?t.  M?t, m?i, mi?ng, h?ng, da ho?c  khu v?c khc b? ng?a.  ?au h?ng.  ?au ??u.  Gi?m c?m gic kh?u gic ho?c v? gic. CH?N ?ON Chu[place] gia ch?m [place] s?c kh?e s? khm th?c th? v h?i v? cc tri?u ch?ng b?n ?ang c. Xt nghi?m v? d? ?ng c th? ???c th?c hi?n ?? xc ??nh chnh xc nguyn nhn kh?i pht b?nh s?t c? ma kh c?a b?n. ?I?U TR?  Thu?c khng c?n k toa c th? gip gi?m cc tri?u ch?ng. Cc thu?c ny bao g?m:  Thu?c khng histamine.  Thu?c gi?m ng?t m?i. Thu?c ny c th? gip gi?m ngh?t m?i.  N?u nh?ng thu?c khng c?n k toa khng c tc d?ng, chuyn gia ch?m [place] s?c kh?e c th? k ??n cc lo?i thu?c khc.  M?t s? ng??i h??ng l?i t? vi?c chch ng?a d? ?ng khi cc lo?i thu?c khc khng c tc d?ng. H??NG D?N CH?M [place] T?I NH  Trnh d? nguyn gy ra cc tri?u ch?ng c?a b?n, n?u c th?.  S? d?ng t?t c? thu?c theo ch? d?n c?a chuyn gia ch?m [place] s?c kh?e. HY ?I KHM N?U:  B?n c cc tri?u ch?ng d? ?ng nghim tr?ng v cc lo?i thu?c hi?n t?i c?a b?n khng c tc d?ng.  Vi?c ?i?u tr? c lc c hi?u qu?, nh?ng by gi? b?n ?ang c cc tri?u ch?ng.  B?n b? sung huy?t v b? p l?c trong xoang.  B?n b? s?t ho?c ?au ??u.  B?n b? ch?y n??c m?i ??c.  B?n b? hen suy?n v b? ho v th? kh kh n?ng thm. HY NGAY L?P T?C ?I KHM N?U:  B?n b? s?ng l??i ho?c mi.  B?n b? kh th?.  B?n c?m th?y chong vng ho?c c?m th?y nh? s?p ng?t.  B?n ?? m? hi l?nh.  B?n b? s?t.   Thng tin ny khng nh?m m?c ?ch thay th? cho l?i khuyn m chuyn gia ch?m [place] s?c kh?e ni v?i qu v?. Hy b?o ??m qu v? ph?i th?o lu?n b?t k? v?n ?? g m qu v? c v?i chuyn gia ch?m [place] s?c kh?e c?a qu v?.   Document Released: 05/24/2005 Document Revised: 01/24/2013 Elsevier Interactive Patient Education [employer]2016 Elsevier Inc.

## 2015-08-28 ENCOUNTER — Telehealth: Payer: Self-pay | Admitting: Licensed Clinical Social Worker

## 2015-08-28 NOTE — Assessment & Plan Note (Signed)
A: PHQ-9 score is 11 today. She is seen smiling and making jokes today. She indicates that she has no suicidal ideation. I explained that should those feelings return, she should to the emergency room. She requests counseling services.  P: Referral to social work to call her and discuss options for counseling.

## 2015-08-28 NOTE — Telephone Encounter (Signed)
[name] was referred to CSW for counseling options.  CSW utilized [employer] Therapist, occupationalinsurance network directory to obtain several [employer]network agencies.  CSW placed call to [name] to inquire if pt would like CSW to inform patient of network providers and assist with scheduling or if pt would like to review network providers and notify CSW when ready to schedule.  Pt states she would like to review list of providers.  CSW informed [name], this worker will mail information on several in-network providers.  However, should pt want immediate have access to network providers, CSW encouraged pt to contact the Behavioral Health number listed on the back of her insurance card for immediate link to network providers.  Pt agreeable.  [name] denied additional social work needs at this time.  Letter mailed with several network providers and walk-in agencies.  CSW will sign off.

## 2015-08-28 NOTE — Assessment & Plan Note (Addendum)
A: Patient has symptoms consistent with seasonal allergies. She is not interested in starting a nasal corticosteroid.  P: Fexofenadine 60 mg BID

## 2015-08-29 NOTE — Progress Notes (Signed)
Internal Medicine Clinic Attending  Case discussed with Dr. Ford at the time of the visit.  We reviewed the resident's history and exam and pertinent patient test results.  I agree with the assessment, diagnosis, and plan of care documented in the resident's note.  

## 2015-12-29 ENCOUNTER — Ambulatory Visit (INDEPENDENT_AMBULATORY_CARE_PROVIDER_SITE_OTHER): Payer: BLUE CROSS/BLUE SHIELD | Admitting: Internal Medicine

## 2015-12-29 ENCOUNTER — Encounter: Payer: Self-pay | Admitting: Internal Medicine

## 2015-12-29 ENCOUNTER — Encounter (INDEPENDENT_AMBULATORY_CARE_PROVIDER_SITE_OTHER): Payer: Self-pay

## 2015-12-29 VITALS — BP 103/48 | HR 65 | Temp 98.4°F | Ht 62.0 in | Wt 122.2 lb

## 2015-12-29 DIAGNOSIS — Z3202 Encounter for pregnancy test, result negative: Secondary | ICD-10-CM | POA: Diagnosis not present

## 2015-12-29 DIAGNOSIS — Z0000 Encounter for general adult medical examination without abnormal findings: Secondary | ICD-10-CM

## 2015-12-29 DIAGNOSIS — L700 Acne vulgaris: Secondary | ICD-10-CM | POA: Diagnosis not present

## 2015-12-29 LAB — POCT URINE PREGNANCY: Preg Test, Ur: NEGATIVE

## 2015-12-29 MED ORDER — TRETINOIN 0.01 % EX GEL: Freq: Every day | CUTANEOUS | 0 refills | Status: DC

## 2015-12-29 NOTE — Assessment & Plan Note (Signed)
A: Patient reports worsening acne over the past month or so.  Her acne seems to be worse with her menstrual cycles.  She has tried OTC Neutrogena without much relief.  She is asking for referral to dermatology.  P: - her acne appears to be mild, non-inflammatory comedonal acne - will refer to dermatology - will also prescribe tretinoin 0.01% gel QHS - urine pregnancy checked was negative and discussed contraception

## 2015-12-29 NOTE — Assessment & Plan Note (Signed)
A/P: - Patient now being over 22 is recommended to have Pap smear every 3 years with HPV not recommended.  At 30 (assuming normal Pap's), patient should have a Pap test and an HPV test (co-testing) every 5 years. - I discussed HPV and offered patient the HPV vaccine today and she declined, stating she would like to think about it further.  If she were to want the vaccine, she should get it at 0, 1-2, and 6 months.

## 2015-12-29 NOTE — Progress Notes (Deleted)
Patient ID: Amber Frey, female   DOB: 18-Mar-1996, 20 y.o.   MRN: [id number]

## 2015-12-29 NOTE — Patient Instructions (Signed)
Thank you for coming to see me today. It was a pleasure. Today we talked about:   Acne: - I have referred you to a dermatologist. - I have sent in a prescription for a gel to use at bedtime.  Health Maintenance: - Now that you are 20, you should be getting Pap smears every 3 years. - Please consider the HPV vaccine that we discussed to help prevent cervical cancers.  Please follow-up with [place] as needed.  If you have any questions or concerns, please do not hesitate to call the office at [phone].  Take Care,   Gwynn Burly, DO

## 2015-12-29 NOTE — Progress Notes (Deleted)
   CC: *** HPI: [name] is a 20 y.o. female with a h/o of *** who presents ***  Please see Problem-based charting for HPI and the status of patient's chronic medical conditions.  Past Medical History:  Diagnosis Date  . Suicidal overdose (HCC)    04/07/2015    Current Outpatient Prescriptions:  .  fexofenadine (ALLEGRA ALLERGY) 60 MG tablet, Take 1 tablet (60 mg total) by mouth 2 (two) times daily., Disp: 60 tablet, Rfl: 1  Family Hx: ***  Social Hx: ***  Review of Systems: ROS in HPI. Otherwise, ***  Physical Exam: Vitals:   12/29/15 1348  BP: (!) 103/48  Pulse: 65  Temp: 98.4 F (36.9 C)  TempSrc: Oral  SpO2: 99%  Weight: 122 lb 3.2 oz (55.4 kg)  Height: 5\' 2"  (1.575 m)   {Exam, Complete:17964}  Assessment & Plan:  See encounters tab for problem based medical decision making. Patient seen with Dr. Oswaldo Done  No problem-specific Assessment & Plan notes found for this encounter.   Signed: Carolynn Comment, MD 12/29/2015, 1:57 PM  Pager: [phone]

## 2015-12-29 NOTE — Progress Notes (Deleted)
   Subjective:    Patient ID: Amber Frey, female    DOB: 08-24-1995, 20 y.o.   MRN: [id number]  HPI    Review of Systems     Objective:   Physical Exam        Assessment & Plan:

## 2015-12-29 NOTE — Progress Notes (Signed)
   CC: to discuss acne and referral to dermatology  HPI:  Amber Frey is a 20 y.o. woman who presents today to discuss her acne and request a referral to be seen by dermatology.  Please see A&P for status of medical conditions addressed today.  Past Medical History:  Diagnosis Date  . Suicidal overdose (HCC)    04/07/2015    Review of Systems:   Review of Systems  Constitutional: Negative for chills and fever.  Skin:       + Acne     Physical Exam:  Vitals:   12/29/15 1348  BP: (!) 103/48  Pulse: 65  Temp: 98.4 F (36.9 C)  TempSrc: Oral  SpO2: 99%  Weight: 122 lb 3.2 oz (55.4 kg)  Height: 5\' 2"  (1.575 m)   Physical Exam  Constitutional: She is well-developed, well-nourished, and in no distress.  HENT:  Head: Normocephalic and atraumatic.  Eyes: EOM are normal.  Skin:  Mild, non-inflammatory appearing acne present on her face.  Psychiatric: Mood and affect normal.    Assessment & Plan:   See encounters tab for problem based medical decision making.   Patient discussed with Dr. Rogelia Boga   Acne vulgaris A: Patient reports worsening acne over the past month or so.  Her acne seems to be worse with her menstrual cycles.  She has tried OTC Neutrogena without much relief.  She is asking for referral to dermatology.  P: - her acne appears to be mild, non-inflammatory comedonal acne - will refer to dermatology - will also prescribe tretinoin 0.01% gel QHS - urine pregnancy checked was negative and discussed contraception  Health care maintenance A/P: - Patient now being over 22 is recommended to have Pap smear every 3 years with HPV not recommended.  At 30 (assuming normal Pap's), patient should have a Pap test and an HPV test (co-testing) every 5 years. - I discussed HPV and offered patient the HPV vaccine today and she declined, stating she would like to think about it further.  If she were to want the vaccine, she should get it at 0, 1-2, and 6  months.

## 2015-12-30 NOTE — Progress Notes (Signed)
Internal Medicine Clinic Attending  Case discussed with Dr. Wallace at the time of the visit.  We reviewed the resident's history and exam and pertinent patient test results.  I agree with the assessment, diagnosis, and plan of care documented in the resident's note.  

## 2016-04-07 ENCOUNTER — Other Ambulatory Visit (HOSPITAL_COMMUNITY)
Admission: RE | Admit: 2016-04-07 | Discharge: 2016-04-07 | Disposition: A | Discharge status: Home / Self Care | Payer: BLUE CROSS/BLUE SHIELD | Source: Ambulatory Visit | Attending: Internal Medicine | Admitting: Internal Medicine

## 2016-04-07 ENCOUNTER — Encounter: Payer: Self-pay | Admitting: Internal Medicine

## 2016-04-07 ENCOUNTER — Ambulatory Visit (INDEPENDENT_AMBULATORY_CARE_PROVIDER_SITE_OTHER): Payer: BLUE CROSS/BLUE SHIELD | Admitting: Internal Medicine

## 2016-04-07 VITALS — BP 119/61 | HR 88 | Temp 98.4°F | Wt 125.3 lb

## 2016-04-07 DIAGNOSIS — Z0000 Encounter for general adult medical examination without abnormal findings: Secondary | ICD-10-CM

## 2016-04-07 DIAGNOSIS — Z01411 Encounter for gynecological examination (general) (routine) with abnormal findings: Secondary | ICD-10-CM | POA: Diagnosis not present

## 2016-04-07 DIAGNOSIS — N898 Other specified noninflammatory disorders of vagina: Secondary | ICD-10-CM | POA: Diagnosis not present

## 2016-04-07 DIAGNOSIS — Z113 Encounter for screening for infections with a predominantly sexual mode of transmission: Secondary | ICD-10-CM | POA: Insufficient documentation

## 2016-04-07 DIAGNOSIS — Z01419 Encounter for gynecological examination (general) (routine) without abnormal findings: Secondary | ICD-10-CM | POA: Diagnosis not present

## 2016-04-07 DIAGNOSIS — Z87448 Personal history of other diseases of urinary system: Secondary | ICD-10-CM | POA: Diagnosis not present

## 2016-04-07 DIAGNOSIS — Z30011 Encounter for initial prescription of contraceptive pills: Secondary | ICD-10-CM

## 2016-04-07 DIAGNOSIS — R310 Gross hematuria: Secondary | ICD-10-CM

## 2016-04-07 LAB — POCT URINE PREGNANCY: PREG TEST UR: NEGATIVE

## 2016-04-07 MED ORDER — NORGESTIM-ETH ESTRAD TRIPHASIC 0.18/0.215/0.25 MG-25 MCG PO TABS: 1.0000 | ORAL_TABLET | Freq: Every day | ORAL | 11 refills | Status: DC

## 2016-04-07 NOTE — Patient Instructions (Signed)
Start taking orthocyclen once a day.   If you notice blood in your urine call the clinic at [phone] for an appointment.    If you mother wishes to have a doctor who speaks [place] ask her to request Dr. Isabella BowensKrall.

## 2016-04-08 DIAGNOSIS — R310 Gross hematuria: Secondary | ICD-10-CM | POA: Insufficient documentation

## 2016-04-08 DIAGNOSIS — Z30011 Encounter for initial prescription of contraceptive pills: Secondary | ICD-10-CM | POA: Insufficient documentation

## 2016-04-08 LAB — MICROSCOPIC EXAMINATION
Bacteria, UA: NONE SEEN
Casts: NONE SEEN /lpf
RBC MICROSCOPIC, UA: NONE SEEN /HPF (ref 0–?)

## 2016-04-08 LAB — URINALYSIS, COMPLETE
BILIRUBIN UA: NEGATIVE
Glucose, UA: NEGATIVE
KETONES UA: NEGATIVE
LEUKOCYTES UA: NEGATIVE
NITRITE UA: NEGATIVE
PH UA: 7.5 (ref 5.0–7.5)
Protein, UA: NEGATIVE
RBC UA: NEGATIVE
Specific Gravity, UA: 1.017 (ref 1.005–1.030)
UUROB: 0.2 mg/dL (ref 0.2–1.0)

## 2016-04-08 LAB — CERVICOVAGINAL ANCILLARY ONLY
CHLAMYDIA, DNA PROBE: NEGATIVE
NEISSERIA GONORRHEA: NEGATIVE

## 2016-04-08 NOTE — Progress Notes (Signed)
Medicine attending: Medical history, presenting problems, physical findings, and medications, reviewed with resident physician Dr Diana Truong on the day of the patient visit and I concur with her evaluation and management plan. 

## 2016-04-08 NOTE — Assessment & Plan Note (Signed)
Screening Pap smear done this visit within normal limits. We will send for cytology and GC.

## 2016-04-08 NOTE — Progress Notes (Signed)
   CC: Pap smear  HPI:  [name] is a 20 y.o. with past medical history as outlined below who presents for a Pap smear. She follows with dermatology for acne treatment. She is on doxycycline for this. She states that her dermatologist started her on OCP pills however she had daily menstrual periods while on this and took herself off of it. She is sexually active with one partner and does not use barrier contraceptives. She also states that she has gross hematuria for the past 2 years and has been taking the ED for this. At that time she was just given antibiotics.  Past Medical History:  Diagnosis Date  . Suicidal overdose (HCC)    04/07/2015    Review of Systems:  Denies vaginal discharge and dysuria. Denies abdominal pain.  Physical Exam:  Vitals:   04/07/16 1646  BP: 119/61  Pulse: 88  Temp: 98.4 F (36.9 C)  TempSrc: Oral  SpO2: 98%  Weight: 125 lb 4.8 oz (56.8 kg)   Physical Exam  Constitutional: She appears well-developed and well-nourished. No distress.  Pulmonary/Chest: Effort normal. No respiratory distress.  Genitourinary: Vagina normal. No labial fusion. There is no rash, tenderness, lesion or injury on the right labia. There is no rash, tenderness, lesion or injury on the left labia. Cervix exhibits discharge (White nonodorous discharge in the vaginal vault). Cervix exhibits no motion tenderness. Right adnexum displays no mass, no tenderness and no fullness. Left adnexum displays no mass, no tenderness and no fullness.  Skin: Skin is warm and dry. No rash noted. She is not diaphoretic. No erythema. No pallor.  Psychiatric: She has a normal mood and affect. Her behavior is normal. Thought content normal.    Assessment & Plan:   See Encounters Tab for problem based charting.  Patient discussed with Dr. Cyndie ChimeGranfortuna

## 2016-04-08 NOTE — Assessment & Plan Note (Signed)
Assessment: Patient cannot recall the OCP pills dermatologist put her on. She has been on Ortho-Cyclen in the past without any problems.  Plan: Urine pregnancy test negative. Since then prescription for Ortho-Cyclen

## 2016-04-08 NOTE — Assessment & Plan Note (Signed)
Assessment: Patient reports two-year history of intermittent gross hematuria. She was evaluated for this prescribed antibiotics UTI. Denies current gross hematuria.  Plan: UA obtained and was negative for hemoglobin. Advised patient to call clinic for an appointment if she notices gross hematuria again

## 2016-04-09 LAB — CYTOLOGY - PAP: DIAGNOSIS: NEGATIVE

## 2016-05-11 ENCOUNTER — Ambulatory Visit (INDEPENDENT_AMBULATORY_CARE_PROVIDER_SITE_OTHER): Payer: BLUE CROSS/BLUE SHIELD | Admitting: Physician Assistant

## 2016-05-11 ENCOUNTER — Ambulatory Visit (INDEPENDENT_AMBULATORY_CARE_PROVIDER_SITE_OTHER): Payer: BLUE CROSS/BLUE SHIELD

## 2016-05-11 VITALS — BP 118/76 | HR 77 | Temp 98.6°F | Resp 16 | Ht 61.0 in | Wt 125.0 lb

## 2016-05-11 DIAGNOSIS — K921 Melena: Secondary | ICD-10-CM

## 2016-05-11 DIAGNOSIS — K5900 Constipation, unspecified: Secondary | ICD-10-CM

## 2016-05-11 LAB — POC HEMOCCULT BLD/STL (OFFICE/1-CARD/DIAGNOSTIC): FECAL OCCULT BLD: POSITIVE — AB

## 2016-05-11 LAB — POCT CBC
Granulocyte percent: 48.9 %G (ref 37–80)
HCT, POC: 41.7 % (ref 37.7–47.9)
Hemoglobin: 14.8 g/dL (ref 12.2–16.2)
Lymph, poc: 2.1 (ref 0.6–3.4)
MCH: 30.7 pg (ref 27–31.2)
MCHC: 35.5 g/dL — AB (ref 31.8–35.4)
MCV: 86.4 fL (ref 80–97)
MID (CBC): 0.3 (ref 0–0.9)
MPV: 7.6 fL (ref 0–99.8)
PLATELET COUNT, POC: 246 10*3/uL (ref 142–424)
POC Granulocyte: 2.3 (ref 2–6.9)
POC LYMPH %: 44.6 % (ref 10–50)
POC MID %: 6.5 % (ref 0–12)
RBC: 4.83 M/uL (ref 4.04–5.48)
RDW, POC: 13.4 %
WBC: 4.7 10*3/uL (ref 4.6–10.2)

## 2016-05-11 LAB — POCT URINE PREGNANCY: Preg Test, Ur: NEGATIVE

## 2016-05-11 MED ORDER — HYDROCORTISONE ACETATE 25 MG RE SUPP
25.0000 mg | Freq: Two times a day (BID) | RECTAL | 0 refills | Status: DC
Start: 2016-05-11 — End: 2017-01-07

## 2016-05-11 NOTE — Patient Instructions (Addendum)
Start drinking at least 64 oz of water daily and decrease your fast food consumption. Increase fiber rich foods in your diet as stated below. If you are not having more frequent bowel movements, you can start miralax OTC.    For bleeding use anucort suppositories.  Please follow up in 2 weeks for reevaluation. Return sooner if symptoms worsen.     IF you received an x-ray today, you will receive an invoice from [facility]. Please contact [facility] at [phone] with questions or concerns regarding your invoice.   IF you received labwork today, you will receive an invoice from [facility]. Please contact [facility] at [phone] with questions or concerns regarding your invoice.   Our billing staff will not be able to assist you with questions regarding bills from these companies.  You will be contacted with the lab results as soon as they are available. The fastest way to get your results is to activate your My Chart account. Instructions are located on the last page of this paperwork. If you have not heard from us regarding the results in 2 weeks, please contact this office.      High-Fiber Diet Fiber, also called dietary fiber, is a type of carbohydrate found in fruits, vegetables, whole grains, and beans. A high-fiber diet can have many health benefits. Your health care provider may recommend a high-fiber diet to help:  Prevent constipation. Fiber can make your bowel movements more regular.  Lower your cholesterol.  Relieve hemorrhoids, uncomplicated diverticulosis, or irritable bowel syndrome.  Prevent overeating as part of a weight-loss plan.  Prevent heart disease, type 2 diabetes, and certain cancers. What is my plan? The recommended daily intake of fiber includes:  38 grams for men under age 22.  30 grams for men over age 22.  25 grams for women under age 22.  21 grams for women over age 22. You can get the recommended  daily intake of dietary fiber by eating a variety of fruits, vegetables, grains, and beans. Your health care provider may also recommend a fiber supplement if it is not possible to get enough fiber through your diet. What do I need to know about a high-fiber diet?  Fiber supplements have not been widely studied for their effectiveness, so it is better to get fiber through food sources.  Always check the fiber content on thenutrition facts label of any prepackaged food. Look for foods that contain at least 5 grams of fiber per serving.  Ask your dietitian if you have questions about specific foods that are related to your condition, especially if those foods are not listed in the following section.  Increase your daily fiber consumption gradually. Increasing your intake of dietary fiber too quickly may cause bloating, cramping, or gas.  Drink plenty of water. Water helps you to digest fiber. What foods can I eat? Grains  Whole-grain breads. Multigrain cereal. Oats and oatmeal. Brown rice. Barley. Bulgur wheat. Millet. Bran muffins. Popcorn. Rye wafer crackers. Vegetables  Sweet potatoes. Spinach. Kale. Artichokes. Cabbage. Broccoli. Green peas. Carrots. Squash. Fruits  Berries. Pears. Apples. Oranges. Avocados. Prunes and raisins. Dried figs. Meats and Other Protein Sources  Navy, kidney, pinto, and soy beans. Split peas. Lentils. Nuts and seeds. Dairy  Fiber-fortified yogurt. Beverages  Fiber-fortified soy milk. Fiber-fortified orange juice. Other  Fiber bars. The items listed above may not be a complete list of recommended foods or beverages. Contact your dietitian for more options.  What foods are not recommended? Grains  White bread. Pasta made with refined flour. White rice. Vegetables  Fried potatoes. Canned vegetables. Well-cooked vegetables. Fruits  Fruit juice. Cooked, strained fruit. Meats and Other Protein Sources  Fatty cuts of meat. Fried Environmental education officerpoultry or fried fish. Dairy   Milk. Yogurt. Cream cheese. Sour cream. Beverages  Soft drinks. Other  Cakes and pastries. Butter and oils. The items listed above may not be a complete list of foods and beverages to avoid. Contact your dietitian for more information.  What are some tips for including high-fiber foods in my diet?  Eat a wide variety of high-fiber foods.  Make sure that half of all grains consumed each day are whole grains.  Replace breads and cereals made from refined flour or white flour with whole-grain breads and cereals.  Replace white rice with brown rice, bulgur wheat, or millet.  Start the day with a breakfast that is high in fiber, such as a cereal that contains at least 5 grams of fiber per serving.  Use beans in place of meat in soups, salads, or pasta.  Eat high-fiber snacks, such as berries, raw vegetables, nuts, or popcorn. This information is not intended to replace advice given to you by your health care provider. Make sure you discuss any questions you have with your health care provider. Document Released: 05/24/2005 Document Revised: 10/30/2015 Document Reviewed: 11/06/2013 Elsevier Interactive Patient Education  2017 [employer]

## 2016-05-11 NOTE — Progress Notes (Signed)
Amber Pamrang Gayman  MRN: [id number] DOB: 05/15/1996  Subjective:  Amber Frey is a 20 y.o. female seen in office today for a chief complaint of blood in stool x 2 weeks. She notices bright pink color on her toilet paper when she wipes. Has associated burining after she has a bowel movement. She denies pain, black stools, diarrhea, chronic use of NSAIDs.   She has 5 bowel movements a week for the past month. (prior to this past month she was having a bowel movement a day). States it has been harder for her to go to the [place] over the past month and she has to strain a lot more. Ranks her stool on bristol chart as type 1 or 3 right now but it is typically a type 4 when she is eating her regular diet.    She has never dealt with constipation. For the past two months she has been eating mcdonalds everyday which is different than her typical dialy vegetable consumption. She notes work has been going so late she goes straight to [employer]. She only drinks about 3 bottles a day of water.   Denies family history of irritable bowel syndrome, diverticulitis, or colon cancer.   Review of Systems  Constitutional: Negative for chills, diaphoresis, fatigue, fever and unexpected weight change.  Gastrointestinal: Negative for abdominal pain, nausea and vomiting.  Genitourinary: Negative for dyspareunia, dysuria and hematuria.  Neurological: Positive for headaches. Negative for dizziness and light-headedness.    Patient Active Problem List   Diagnosis Date Noted  . OCP (oral contraceptive pills) initiation 04/08/2016  . Gross hematuria 04/08/2016  . Acne vulgaris 12/29/2015  . Health care maintenance 12/29/2015  . Seasonal allergies 08/27/2015  . MDD (major depressive disorder), single episode, severe , no psychosis (HCC) 04/12/2015  . Suicide attempt     Current Outpatient Prescriptions on File Prior to Visit  Medication Sig Dispense Refill  . fexofenadine (ALLEGRA ALLERGY) 60 MG tablet Take 1 tablet (60  mg total) by mouth 2 (two) times daily. 60 tablet 1  . Norgestimate-Ethinyl Estradiol Triphasic (ORTHO TRI-CYCLEN LO) 0.18/0.215/0.25 MG-25 MCG tab Take 1 tablet by mouth daily. 1 Package 11  . tretinoin (RETIN-A) 0.01 % gel Apply topically at bedtime. 45 g 0   No current facility-administered medications on file prior to visit.     No Known Allergies   Objective:  BP 118/76 (BP Location: Right Arm, Patient Position: Sitting, Cuff Size: Normal)   Pulse 77   Temp 98.6 F (37 C) (Oral)   Resp 16   Ht 5\' 1"  (1.549 m)   Wt 125 lb (56.7 kg)   LMP 05/10/2016 (Exact Date)   SpO2 99%   BMI 23.62 kg/m   Physical Exam  Constitutional: She is oriented to person, place, and time and well-developed, well-nourished, and in no distress.  HENT:  Head: Normocephalic and atraumatic.  Eyes: Conjunctivae are normal.  Neck: Normal range of motion.  Pulmonary/Chest: Effort normal.  Abdominal: Soft. Normal appearance and bowel sounds are normal. There is tenderness in the right lower quadrant and left lower quadrant.  Genitourinary: Rectal exam shows no external hemorrhoid, no fissure, no mass and no tenderness.  Neurological: She is alert and oriented to person, place, and time. Gait normal.  Skin: Skin is warm and dry.  Psychiatric: Affect normal.  Vitals reviewed.   Results for orders placed or performed in visit on 05/11/16 (from the past 24 hour(s))  POC Hemoccult Bld/Stl (1-Cd Office Dx)  Status: Abnormal   Collection Time: 05/11/16  9:04 AM  Result Value Ref Range   Card #1 Date 05-11-16    Fecal Occult Blood, POC Positive (A) Negative  POCT CBC     Status: Abnormal   Collection Time: 05/11/16  9:23 AM  Result Value Ref Range   WBC 4.7 4.6 - 10.2 K/uL   Lymph, poc 2.1 0.6 - 3.4   POC LYMPH PERCENT 44.6 10 - 50 %L   MID (cbc) 0.3 0 - 0.9   POC MID % 6.5 0 - 12 %M   POC Granulocyte 2.3 2 - 6.9   Granulocyte percent 48.9 37 - 80 %G   RBC 4.83 4.04 - 5.48 M/uL   Hemoglobin 14.8  12.2 - 16.2 g/dL   HCT, POC 40.941.7 81.137.7 - 47.9 %   MCV 86.4 80 - 97 fL   MCH, POC 30.7 27 - 31.2 pg   MCHC 35.5 (A) 31.8 - 35.4 g/dL   RDW, POC 91.413.4 %   Platelet Count, POC 246 142 - 424 K/uL   MPV 7.6 0 - 99.8 fL  POCT urine pregnancy     Status: None   Collection Time: 05/11/16  9:24 AM  Result Value Ref Range   Preg Test, Ur Negative Negative   Dg Abd 1 View  Result Date: 05/11/2016 CLINICAL DATA:  Left lower quadrant pain EXAM: ABDOMEN - 1 VIEW COMPARISON:  None. FINDINGS: There is normal small bowel gas pattern. Moderate stool noted left colon and rectosigmoid colon. IMPRESSION: Normal small bowel gas pattern.  Moderate stool in distal colon. Electronically Signed   By: Natasha MeadLiviu  Pop M.D.   On: 05/11/2016 09:45    Assessment and Plan :  1. Blood in stool -History consistent with internal hemorrhoids, will treat accordingly.  - POC Hemoccult Bld/Stl (1-Cd Office Dx) - POCT CBC - DG Abd 1 View; Future - POCT urine pregnancy - hydrocortisone (ANUSOL-HC) 25 MG suppository; Place 1 suppository (25 mg total) rectally 2 (two) times daily.  Dispense: 12 suppository; Refill: 0  2. Constipation, unspecified constipation type -Encouraged high fiber diet and to increase water consumption to 64 oz. Decrease fast food and go back to regular diet including lots of vegetables. If bowels are not moving in a couple of days, instructed to call our office.   -Return to clinic if symptoms worsen, do not improve In 2 weeks, or as needed    Benjiman CoreBrittany Braden Cimo PA-C  [facility] 05/11/2016 9:59 AM

## 2016-06-03 ENCOUNTER — Ambulatory Visit: Payer: BLUE CROSS/BLUE SHIELD | Admitting: Physician Assistant

## 2016-08-24 ENCOUNTER — Ambulatory Visit (INDEPENDENT_AMBULATORY_CARE_PROVIDER_SITE_OTHER): Payer: BLUE CROSS/BLUE SHIELD | Admitting: Urgent Care

## 2016-08-24 VITALS — BP 110/58 | HR 76 | Temp 98.0°F | Ht 61.0 in | Wt 130.2 lb

## 2016-08-24 DIAGNOSIS — L723 Sebaceous cyst: Secondary | ICD-10-CM | POA: Diagnosis not present

## 2016-08-24 DIAGNOSIS — R221 Localized swelling, mass and lump, neck: Secondary | ICD-10-CM | POA: Diagnosis not present

## 2016-08-24 NOTE — Progress Notes (Signed)
  MRN: [id number] DOB: 05/09/1996  Subjective:   Amber Frey is a 21 y.o. female presenting for chief complaint of Neck Lump/Mass (X 2-3 mths)  Reports 3 month history of worsening mass over her neck. This has progressed to pain in the past 3 weeks. Has not tried any medications for relief. Denies fever, erythema, warmth, weight loss. Denies smoking cigarettes or drinking alcohol.   [name] has a current medication list which includes the following prescription(s): fexofenadine, hydrocortisone, norgestimate-ethinyl estradiol triphasic, and tretinoin. Also has No Known Allergies. [name]  has a past medical history of Suicidal overdose (HCC). Also denies past surgical history. Her family history includes Heart disease in her maternal grandmother; Hypertension in her maternal grandfather, maternal grandmother, and mother.   Objective:   Vitals: BP (!) 110/58 (BP Location: Right Arm, Patient Position: Sitting, Cuff Size: Small)   Pulse 76   Temp 98 F (36.7 C) (Oral)   Ht 5\' 1"  (1.549 m)   Wt 130 lb 3.2 oz (59.1 kg)   LMP 08/23/2016 (Exact Date)   SpO2 97%   BMI 24.60 kg/m   Wt Readings from Last 3 Encounters:  08/24/16 130 lb 3.2 oz (59.1 kg)  05/11/16 125 lb (56.7 kg)  04/07/16 125 lb 4.8 oz (56.8 kg)   Physical Exam  Constitutional: She is oriented to person, place, and time. She appears well-developed and well-nourished.  HENT:  Head:    Cardiovascular: Normal rate.   Pulmonary/Chest: Effort normal.  Neurological: She is alert and oriented to person, place, and time.   PROCEDURE NOTE: sebaceous cyst excision Verbal consent obtained. Local anesthesia with 1.5cc of 1% lidocaine with epinephrine. Sterile prep and drape. An elliptical incision was made extending ~1cm using a 15 blade, the cyst was dissected from the surrounding tissue with iris scissors. Wound closed with #3 5-0 Prolene (#1 central horizontal mattress, #2 simple interrupted) sutures. Cleansed and  dressed.  Assessment and Plan :   1. Sebaceous cyst 2. Neck mass - Cyst removed in its entirety with sac intact. Anticipatory guidance provided. Signs and symptoms of infection, wound care reviewed. RTC on 09/02/2016 for suture removal.  Wallis BambergMario Briunna Leicht, PA-C [facility] [phone] 08/24/2016  10:21 AM

## 2016-08-24 NOTE — Patient Instructions (Signed)
Epidermal Cyst Removal, Care After Refer to this sheet in the next few weeks. These instructions provide you with information about caring for yourself after your procedure. Your health care provider may also give you more specific instructions. Your treatment has been planned according to current medical practices, but problems sometimes occur. Call your health care provider if you have any problems or questions after your procedure. What can I expect after the procedure? After the procedure, it is common to have:  Soreness in the area where your cyst was removed.  Tightness or itching from your skin sutures. Follow these instructions at home:  Take medicines only as directed by your health care provider.  If you were prescribed an antibiotic medicine, finish all of it even if you start to feel better.  Use antibiotic ointment as directed by your health care provider. Follow the instructions carefully.  There are many different ways to close and cover an incision, including stitches (sutures), skin glue, and adhesive strips. Follow your health care provider's instructions about:  Incision care.  Bandage (dressing) changes and removal.  Incision closure removal.  Keep the bandage (dressing) dry until your health care provider says that it can be removed. Take sponge baths only. Ask your health care provider when you can start showering or taking a bath.  After your dressing is off, check your incision every day for signs of infection. Watch for:  Redness, swelling, or pain.  Fluid, blood, or pus.  You can return to your normal activities. Do not do anything that stretches or puts pressure on your incision.  You can return to your normal diet.  Keep all follow-up visits as directed by your health care provider. This is important. Contact a health care provider if:  You have a fever.  Your incision bleeds.  You have redness, swelling, or pain in the incision area.  You have  fluid, blood, or pus coming from your incision.  Your cyst comes back after surgery. This information is not intended to replace advice given to you by your health care provider. Make sure you discuss any questions you have with your health care provider. Document Released: 06/14/2014 Document Revised: 10/30/2015 Document Reviewed: 02/06/2014 Elsevier Interactive Patient Education  2017 Elsevier Inc.  

## 2016-09-02 ENCOUNTER — Encounter: Payer: Self-pay | Admitting: Urgent Care

## 2016-09-02 ENCOUNTER — Ambulatory Visit (INDEPENDENT_AMBULATORY_CARE_PROVIDER_SITE_OTHER): Payer: BLUE CROSS/BLUE SHIELD | Admitting: Urgent Care

## 2016-09-02 VITALS — BP 106/72 | HR 75 | Temp 98.9°F | Ht 61.0 in | Wt 128.2 lb

## 2016-09-02 DIAGNOSIS — L723 Sebaceous cyst: Secondary | ICD-10-CM

## 2016-09-02 DIAGNOSIS — Z4802 Encounter for removal of sutures: Secondary | ICD-10-CM

## 2016-09-02 NOTE — Patient Instructions (Addendum)
Epidermal Cyst Removal, Care After Refer to this sheet in the next few weeks. These instructions provide you with information about caring for yourself after your procedure. Your health care provider may also give you more specific instructions. Your treatment has been planned according to current medical practices, but problems sometimes occur. Call your health care provider if you have any problems or questions after your procedure. What can I expect after the procedure? After the procedure, it is common to have:  Soreness in the area where your cyst was removed.  Tightness or itching from your skin sutures. Follow these instructions at home:  Take medicines only as directed by your health care provider.  If you were prescribed an antibiotic medicine, finish all of it even if you start to feel better.  Use antibiotic ointment as directed by your health care provider. Follow the instructions carefully.  There are many different ways to close and cover an incision, including stitches (sutures), skin glue, and adhesive strips. Follow your health care provider's instructions about:  Incision care.  Bandage (dressing) changes and removal.  Incision closure removal.  Keep the bandage (dressing) dry until your health care provider says that it can be removed. Take sponge baths only. Ask your health care provider when you can start showering or taking a bath.  After your dressing is off, check your incision every day for signs of infection. Watch for:  Redness, swelling, or pain.  Fluid, blood, or pus.  You can return to your normal activities. Do not do anything that stretches or puts pressure on your incision.  You can return to your normal diet.  Keep all follow-up visits as directed by your health care provider. This is important. Contact a health care provider if:  You have a fever.  Your incision bleeds.  You have redness, swelling, or pain in the incision area.  You have  fluid, blood, or pus coming from your incision.  Your cyst comes back after surgery. This information is not intended to replace advice given to you by your health care provider. Make sure you discuss any questions you have with your health care provider. Document Released: 06/14/2014 Document Revised: 10/30/2015 Document Reviewed: 02/06/2014 Elsevier Interactive Patient Education  2017 Elsevier Inc.     IF you received an x-ray today, you will receive an invoice from [facility]. Please contact [facility] at [phone] with questions or concerns regarding your invoice.   IF you received labwork today, you will receive an invoice from [facility]. Please contact [facility] at [phone] with questions or concerns regarding your invoice.   Our billing staff will not be able to assist you with questions regarding bills from these companies.  You will be contacted with the lab results as soon as they are available. The fastest way to get your results is to activate your My Chart account. Instructions are located on the last page of this paperwork. If you have not heard from us regarding the results in 2 weeks, please contact this office.      

## 2016-09-02 NOTE — Progress Notes (Signed)
   Patient: Amber Frey [id number]  Subjective: [name] is returning for suture removal. Patient was initially seen 08/24/2016 and had sebaceous cyst removed from posterior neck. Denies fever, drainage of pus or blood, wound dehiscence, edema, pain.   Objective: BP 106/72 (BP Location: Right Arm, Patient Position: Sitting, Cuff Size: Small)   Pulse 75   Temp 98.9 F (37.2 C) (Oral)   Ht 5\' 1"  (1.549 m)   Wt 128 lb 3.2 oz (58.2 kg)   LMP 08/23/2016 (Exact Date)   SpO2 100%   BMI 24.22 kg/m    Physical Exam  Constitutional: She is oriented to person, place, and time and well-developed, well-nourished, and in no distress.  Cardiovascular: Normal rate.   Pulmonary/Chest: Effort normal.  Neurological: She is alert and oriented to person, place, and time.  Skin:       #3 sutures removed without incident. Patient tolerated this well.  Assessment and Plan: Well-healed wound. Anticipatory guidance provided. Return to clinic as needed.  Wallis BambergMario Hamp Moreland, PA-C [facility] [phone] 09/02/2016  8:07 AM

## 2017-01-07 ENCOUNTER — Encounter: Payer: Self-pay | Admitting: Physician Assistant

## 2017-01-07 ENCOUNTER — Ambulatory Visit (INDEPENDENT_AMBULATORY_CARE_PROVIDER_SITE_OTHER): Payer: BLUE CROSS/BLUE SHIELD | Admitting: Physician Assistant

## 2017-01-07 VITALS — BP 105/67 | HR 67 | Temp 98.6°F | Resp 16 | Ht 61.0 in | Wt 125.2 lb

## 2017-01-07 DIAGNOSIS — Z3042 Encounter for surveillance of injectable contraceptive: Secondary | ICD-10-CM

## 2017-01-07 DIAGNOSIS — Z3049 Encounter for surveillance of other contraceptives: Secondary | ICD-10-CM | POA: Diagnosis not present

## 2017-01-07 LAB — POCT URINE PREGNANCY: Preg Test, Ur: NEGATIVE

## 2017-01-07 MED ORDER — MEDROXYPROGESTERONE ACETATE 150 MG/ML IM SUSP
150.0000 mg | Freq: Once | INTRAMUSCULAR | Status: AC
  Administered 2017-01-07: 150 mg via INTRAMUSCULAR

## 2017-01-07 NOTE — Progress Notes (Signed)
Subjective:    Amber Frey is a 21 y.o. female who presents for contraception counseling. The patient has no complaints today. The patient is sexually active. Pertinent past medical history: none. Pt denies smoking, hypertension, liver disease, migraines, pelvic inflammatory disease, sexually transmitted diseases and thromboembolism. She has not used a contraceptive method in >1 year. Has tried depo shots in the past and really liked it. She has also tried oral contraceptive in the past but cycles were more frequent so she stopped using them.  Menstrual History: OB History    No data available      Menarche age: [age]  Reports cycles have always been irregular, occur every 3-4 months, typically last 7 days. Denies menorrhagia and dysmenorrhea.   Patient's last menstrual period was 12/24/2016. She has had unprotected sexual intercourse since last cycle.    Review of Systems Review of systems not obtained due to patient factors.   Objective:   Vitals:   01/07/17 0813  BP: 105/67  Pulse: 67  Resp: 16  Temp: 98.6 F (37 C)   Physical Exam  Constitutional: She is oriented to person, place, and time and well-developed, well-nourished, and in no distress.  HENT:  Head: Normocephalic and atraumatic.  Eyes: Conjunctivae are normal.  Neck: Normal range of motion.  Cardiovascular: Normal rate, regular rhythm and normal heart sounds.   Pulmonary/Chest: Effort normal and breath sounds normal.  Neurological: She is alert and oriented to person, place, and time. Gait normal.  Skin: Skin is warm and dry.  Psychiatric: Affect normal.  Vitals reviewed.  Results for orders placed or performed in visit on 01/07/17 (from the past 24 hour(s))  POCT urine pregnancy     Status: None   Collection Time: 01/07/17  8:39 AM  Result Value Ref Range   Preg Test, Ur Negative Negative    Assessment and Plan:    21 y.o., starting Depo-Provera injections, no contraindications.    1. Encounter for  surveillance of injectable contraceptive Pregnancy test negative. However, pt LMP started > 5 days ago and she has had unprotected sexual intercourse < 5 days ago. Pt declines emergency contraception today. Encouraged to use back up method for the next 7 days. Return in 2 weeks for repeat pregnancy test. If repeat preg test negative in 2 weeks, pt to follow up in 3 months from today for depo-provera injection. - POCT urine pregnancy - medroxyPROGESTERone (DEPO-PROVERA) injection 150 mg; Inject 1 mL (150 mg total) into the muscle once.  Benjiman CoreBrittany Kinya Meine, PA-C  [facility] 01/07/2017 8:54 AM

## 2017-01-07 NOTE — Patient Instructions (Addendum)
As discussed today in office, due to where you are in your cycle, we cannot 100% say that you are not pregnant despite a negative pregnancy test in office today. There has been no risk to fetus assoicated with using depo-provera during pregnancy. However, you do need to return in 2 weeks for repeat pregnancy test in office. You can schedule this appointment with me. Thank you for letting me participate in your health and well being. Medroxyprogesterone injection [Contraceptive] What is this medicine? MEDROXYPROGESTERONE (me DROX ee proe JES te rone) contraceptive injections prevent pregnancy. They provide effective birth control for 3 months. Depo-subQ Provera 104 is also used for treating pain related to endometriosis. This medicine may be used for other purposes; ask your health care provider or pharmacist if you have questions. COMMON BRAND NAME(S): Depo-Provera, Depo-subQ Provera 104 What should I tell my health care provider before I take this medicine? They need to know if you have any of these conditions: -frequently drink alcohol -asthma -blood vessel disease or a history of a blood clot in the lungs or legs -bone disease such as osteoporosis -breast cancer -diabetes -eating disorder (anorexia nervosa or bulimia) -high blood pressure -HIV infection or AIDS -kidney disease -liver disease -mental depression -migraine -seizures (convulsions) -stroke -tobacco smoker -vaginal bleeding -an unusual or allergic reaction to medroxyprogesterone, other hormones, medicines, foods, dyes, or preservatives -pregnant or trying to get pregnant -breast-feeding How should I use this medicine? Depo-Provera Contraceptive injection is given into a muscle. Depo-subQ Provera 104 injection is given under the skin. These injections are given by a health care professional. You must not be pregnant before getting an injection. The injection is usually given during the first 5 days after the start of a  menstrual period or 6 weeks after delivery of a baby. Talk to your pediatrician regarding the use of this medicine in children. Special care may be needed. These injections have been used in female children who have started having menstrual periods. Overdosage: If you think you have taken too much of this medicine contact a poison control center or emergency room at once. NOTE: This medicine is only for you. Do not share this medicine with others. What if I miss a dose? Try not to miss a dose. You must get an injection once every 3 months to maintain birth control. If you cannot keep an appointment, call and reschedule it. If you wait longer than 13 weeks between Depo-Provera contraceptive injections or longer than 14 weeks between Depo-subQ Provera 104 injections, you could get pregnant. Use another method for birth control if you miss your appointment. You may also need a pregnancy test before receiving another injection. What may interact with this medicine? Do not take this medicine with any of the following medications: -bosentan This medicine may also interact with the following medications: -aminoglutethimide -antibiotics or medicines for infections, especially rifampin, rifabutin, rifapentine, and griseofulvin -aprepitant -barbiturate medicines such as phenobarbital or primidone -bexarotene -carbamazepine -medicines for seizures like ethotoin, felbamate, oxcarbazepine, phenytoin, topiramate -modafinil -St. John's wort This list may not describe all possible interactions. Give your health care provider a list of all the medicines, herbs, non-prescription drugs, or dietary supplements you use. Also tell them if you smoke, drink alcohol, or use illegal drugs. Some items may interact with your medicine. What should I watch for while using this medicine? This drug does not protect you against HIV infection (AIDS) or other sexually transmitted diseases. Use of this product may cause you to  lose calcium  from your bones. Loss of calcium may cause weak bones (osteoporosis). Only use this product for more than 2 years if other forms of birth control are not right for you. The longer you use this product for birth control the more likely you will be at risk for weak bones. Ask your health care professional how you can keep strong bones. You may have a change in bleeding pattern or irregular periods. Many females stop having periods while taking this drug. If you have received your injections on time, your chance of being pregnant is very low. If you think you may be pregnant, see your health care professional as soon as possible. Tell your health care professional if you want to get pregnant within the next year. The effect of this medicine may last a long time after you get your last injection. What side effects may I notice from receiving this medicine? Side effects that you should report to your doctor or health care professional as soon as possible: -allergic reactions like skin rash, itching or hives, swelling of the face, lips, or tongue -breast tenderness or discharge -breathing problems -changes in vision -depression -feeling faint or lightheaded, falls -fever -pain in the abdomen, chest, groin, or leg -problems with balance, talking, walking -unusually weak or tired -yellowing of the eyes or skin Side effects that usually do not require medical attention (report to your doctor or health care professional if they continue or are bothersome): -acne -fluid retention and swelling -headache -irregular periods, spotting, or absent periods -temporary pain, itching, or skin reaction at site where injected -weight gain This list may not describe all possible side effects. Call your doctor for medical advice about side effects. You may report side effects to FDA at 1-800-FDA-1088. Where should I keep my medicine? This does not apply. The injection will be given to you by a health  care professional. NOTE: This sheet is a summary. It may not cover all possible information. If you have questions about this medicine, talk to your doctor, pharmacist, or health care provider.  2018 Elsevier/Gold Standard (2008-06-14 18:37:56)  IF you received an x-ray today, you will receive an invoice from [facility]. Please contact [facility] at [phone] with questions or concerns regarding your invoice.   IF you received labwork today, you will receive an invoice from [facility]. Please contact [facility] at [phone] with questions or concerns regarding your invoice.   Our billing staff will not be able to assist you with questions regarding bills from these companies.  You will be contacted with the lab results as soon as they are available. The fastest way to get your results is to activate your My Chart account. Instructions are located on the last page of this paperwork. If you have not heard from us regarding the results in 2 weeks, please contact this office.

## 2017-02-17 ENCOUNTER — Ambulatory Visit: Payer: BLUE CROSS/BLUE SHIELD | Admitting: Urgent Care

## 2017-02-17 ENCOUNTER — Encounter: Payer: Self-pay | Admitting: Family Medicine

## 2017-02-17 ENCOUNTER — Ambulatory Visit (INDEPENDENT_AMBULATORY_CARE_PROVIDER_SITE_OTHER): Payer: BLUE CROSS/BLUE SHIELD | Admitting: Family Medicine

## 2017-02-17 VITALS — BP 104/68 | HR 60 | Temp 98.0°F | Resp 18 | Ht 61.0 in | Wt 125.4 lb

## 2017-02-17 DIAGNOSIS — Z308 Encounter for other contraceptive management: Secondary | ICD-10-CM

## 2017-02-17 MED ORDER — NORGESTIMATE-ETH ESTRADIOL 0.25-35 MG-MCG PO TABS: 1.0000 | ORAL_TABLET | Freq: Every day | ORAL | 11 refills | Status: DC

## 2017-02-17 NOTE — Progress Notes (Signed)
   9/13/201810:43 AM  Amber Frey 05/18/1996, 21 y.o. female [id number]  Chief Complaint  Patient presents with  . Menstrual Problem    got a depo shot about a month ago and has had her period for over a week now     HPI:   Patient is a 21 y.o. female who presents today requesting change on birth control. Patient had been on depo before without any issues about 3 years ago. However she restarted this method on 01/07/17 and has been bleeding for almost 2 weeks, light and dark, but patient is not happy with this and does not want to continue with the risk of irregular bleeding. Has used OCPs without issues in the past.  She denies any h/o migraines, VTE, smoking.  Denies any fhx VTE or CVA.   [facility] 02/17/2017 01/07/2017 09/02/2016  Decreased Interest 0 0 0  Down, Depressed, Hopeless 0 0 0  PHQ - 2 Score 0 0 0  Altered sleeping - - -  Tired, decreased energy - - -  Change in appetite - - -  Feeling bad or failure about yourself  - - -  Trouble concentrating - - -  Moving slowly or fidgety/restless - - -  Suicidal thoughts - - -  PHQ-9 Score - - -  Difficult doing work/chores - - -    No Known Allergies  No current outpatient prescriptions on file prior to visit.   No current facility-administered medications on file prior to visit.     Past Medical History:  Diagnosis Date  . Suicidal overdose (HCC)    04/07/2015    No past surgical history on file.  Social History  Substance Use Topics  . Smoking status: Never Smoker  . Smokeless tobacco: Never Used  . Alcohol use 0.0 oz/week    Family History  Problem Relation Age of Onset  . Hypertension Mother   . Hypertension Maternal Grandmother   . Heart disease Maternal Grandmother   . Hypertension Maternal Grandfather     ROS Per HPI  OBJECTIVE:  Blood pressure 104/68, pulse 60, temperature 98 F (36.7 C), temperature source Oral, resp. rate 18, height 5\' 1"  (1.549 m), weight 125 lb 6.4 oz (56.9  kg), last menstrual period 02/17/2017, SpO2 98 %.  Physical Exam  Constitutional: She is oriented to person, place, and time and well-developed, well-nourished, and in no distress.  HENT:  Head: Normocephalic and atraumatic.  Eyes: Pupils are equal, round, and reactive to light. EOM are normal.  Neck: Neck supple.  Pulmonary/Chest: Effort normal.  Neurological: She is alert and oriented to person, place, and time. Gait normal.  Skin: Skin is warm and dry.    Results for orders placed or performed in visit on 01/07/17  POCT urine pregnancy  Result Value Ref Range   Preg Test, Ur Negative Negative     ASSESSMENT and PLAN:  1. Encounter for other contraceptive management 50% of this 25 minute visit was spent counseling of birth control. Discussed common side effects of depo and ways to manage. Discussed birth control options. Discussed OCPs r/se/b. Started on sprintec. Discussed option of continuous use if doing well on on this medication. Patient educational handouts given.       Myles LippsIrma M Santiago, MD [facility] [address] [place], [place] [address] Ph.  [phone] Fax [phone]

## 2017-02-17 NOTE — Patient Instructions (Addendum)
   IF you received an x-ray today, you will receive an invoice from [facility]. Please contact [facility] at [phone] with questions or concerns regarding your invoice.   IF you received labwork today, you will receive an invoice from [facility]. Please contact [facility] at [phone] with questions or concerns regarding your invoice.   Our billing staff will not be able to assist you with questions regarding bills from these companies.  You will be contacted with the lab results as soon as they are available. The fastest way to get your results is to activate your My Chart account. Instructions are located on the last page of this paperwork. If you have not heard from us regarding the results in 2 weeks, please contact this office.      Oral Contraception Information Oral contraceptive pills (OCPs) are medicines taken to prevent pregnancy. OCPs work by preventing the ovaries from releasing eggs. The hormones in OCPs also cause the cervical mucus to thicken, preventing the sperm from entering the uterus. The hormones also cause the uterine lining to become thin, not allowing a fertilized egg to attach to the inside of the uterus. OCPs are highly effective when taken exactly as prescribed. However, OCPs do not prevent sexually transmitted diseases (STDs). Safe sex practices, such as using condoms along with the pill, can help prevent STDs. Before taking the pill, you may have a physical exam and Pap test. Your health care provider may order blood tests. The health care provider will make sure you are a good candidate for oral contraception. Discuss with your health care provider the possible side effects of the OCP you may be prescribed. When starting an OCP, it can take 2 to 3 months for the body to adjust to the changes in hormone levels in your body. Types of oral contraception  The combination pill-This pill contains estrogen and progestin (synthetic progesterone)  hormones. The combination pill comes in 21-day, 28-day, or 91-day packs. Some types of combination pills are meant to be taken continuously (365-day pills). With 21-day packs, you do not take pills for 7 days after the last pill. With 28-day packs, the pill is taken every day. The last 7 pills are without hormones. Certain types of pills have more than 21 hormone-containing pills. With 91-day packs, the first 84 pills contain both hormones, and the last 7 pills contain no hormones or contain estrogen only.  The minipill-This pill contains the progesterone hormone only. The pill is taken every day continuously. It is very important to take the pill at the same time each day. The minipill comes in packs of 28 pills. All 28 pills contain the hormone. Advantages of oral contraceptive pills  Decreases premenstrual symptoms.  Treats menstrual period cramps.  Regulates the menstrual cycle.  Decreases a heavy menstrual flow.  May treatacne, depending on the type of pill.  Treats abnormal uterine bleeding.  Treats polycystic ovarian syndrome.  Treats endometriosis.  Can be used as emergency contraception. Things that can make oral contraceptive pills less effective OCPs can be less effective if:  You forget to take the pill at the same time every day.  You have a stomach or intestinal disease that lessens the absorption of the pill.  You take OCPs with other medicines that make OCPs less effective, such as antibiotics, certain HIV medicines, and some seizure medicines.  You take expired OCPs.  You forget to restart the pill on day 7, when using the packs of 21 pills.  Risks   associated with oral contraceptive pills Oral contraceptive pills can sometimes cause side effects, such as:  Headache.  Nausea.  Breast tenderness.  Irregular bleeding or spotting.  Combination pills are also associated with a small increased risk of:  Blood clots.  Heart attack.  Stroke.  This  information is not intended to replace advice given to you by your health care provider. Make sure you discuss any questions you have with your health care provider. Document Released: 08/14/2002 Document Revised: 10/30/2015 Document Reviewed: 11/12/2012 Elsevier Interactive Patient Education  2018 Elsevier Inc.  

## 2017-03-28 ENCOUNTER — Encounter: Payer: Self-pay | Admitting: Family Medicine

## 2017-03-28 ENCOUNTER — Ambulatory Visit (INDEPENDENT_AMBULATORY_CARE_PROVIDER_SITE_OTHER): Payer: BLUE CROSS/BLUE SHIELD | Admitting: Family Medicine

## 2017-03-28 VITALS — BP 116/70 | HR 79 | Temp 98.7°F | Resp 18 | Ht 61.0 in | Wt 127.8 lb

## 2017-03-28 DIAGNOSIS — K5900 Constipation, unspecified: Secondary | ICD-10-CM | POA: Diagnosis not present

## 2017-03-28 DIAGNOSIS — R1084 Generalized abdominal pain: Secondary | ICD-10-CM

## 2017-03-28 DIAGNOSIS — Z23 Encounter for immunization: Secondary | ICD-10-CM

## 2017-03-28 DIAGNOSIS — R112 Nausea with vomiting, unspecified: Secondary | ICD-10-CM | POA: Diagnosis not present

## 2017-03-28 LAB — POCT URINALYSIS DIP (MANUAL ENTRY)
Bilirubin, UA: NEGATIVE
Glucose, UA: NEGATIVE mg/dL
Ketones, POC UA: NEGATIVE mg/dL
Leukocytes, UA: NEGATIVE
NITRITE UA: NEGATIVE
PROTEIN UA: NEGATIVE mg/dL
RBC UA: NEGATIVE
UROBILINOGEN UA: 0.2 U/dL
pH, UA: 6 (ref 5.0–8.0)

## 2017-03-28 LAB — POC MICROSCOPIC URINALYSIS (UMFC): Mucus: ABSENT

## 2017-03-28 LAB — POCT CBC
Granulocyte percent: 43.1 %G (ref 37–80)
HCT, POC: 43.2 % (ref 37.7–47.9)
HEMOGLOBIN: 14.5 g/dL (ref 12.2–16.2)
LYMPH, POC: 2.6 (ref 0.6–3.4)
MCH: 29.3 pg (ref 27–31.2)
MCHC: 33.6 g/dL (ref 31.8–35.4)
MCV: 87.3 fL (ref 80–97)
MID (CBC): 0.4 (ref 0–0.9)
MPV: 7.5 fL (ref 0–99.8)
POC Granulocyte: 2.3 (ref 2–6.9)
POC LYMPH PERCENT: 49.9 %L (ref 10–50)
POC MID %: 7 %M (ref 0–12)
Platelet Count, POC: 305 10*3/uL (ref 142–424)
RBC: 4.95 M/uL (ref 4.04–5.48)
RDW, POC: 13.2 %
WBC: 5.3 10*3/uL (ref 4.6–10.2)

## 2017-03-28 LAB — POCT URINE PREGNANCY: Preg Test, Ur: NEGATIVE

## 2017-03-28 MED ORDER — ONDANSETRON HCL 4 MG PO TABS: 4.0000 mg | ORAL_TABLET | Freq: Three times a day (TID) | ORAL | 0 refills | Status: DC | PRN

## 2017-03-28 NOTE — Progress Notes (Signed)
Amber Frey   

## 2017-03-28 NOTE — Progress Notes (Signed)
Subjective:    Patient ID: Amber Pamrang Kannan, female    DOB: 01/02/1996, 21 y.o.   MRN: [id number]   I,Amoya Bennett,acting as a scribe for Shade FloodGREENE,Concetta Guion R, MD.,have documented all relevant documentation on the behalf of Refugio Mcconico R, MD,as directed by  Shade FloodGREENE,Mayra Jolliffe R, MD while in the presence of Shade FloodGREENE,Rheana Casebolt R, MD.   Chief Complaint  Patient presents with  . Abdominal Cramping    has pain everytime pt eats   x1.5 weeks   . Constipation  . Emesis    pt states she sometimes vomits before she works out      HPI Amber Frey is a 21 y.o. female who presents to Primary Care at [facility] complaining of the following: Abdominal Cramping  Associated symptoms include constipation, nausea and vomiting. Pertinent negatives include no diarrhea, dysuria, fever or hematuria.  Constipation  Associated symptoms include abdominal pain, nausea and vomiting. Pertinent negatives include no diarrhea or fever.  Emesis   Associated symptoms include abdominal pain. Pertinent negatives include no diarrhea or fever.   Today she is here due to abdominal pain with constipation and emesis  She has history of sever depression with a prior suicide attempt. She experiences allergies and acne.   She notes her stomach has been irritated recently. Starting 5 days ago she felt cramping pain in her stomach along with one episode of emesis. Since then every time she eats she has pain in her stomach. She reports she has a bland food diet due to her stomach irritation. She has experience nausea as an associated symptom for the past 5 days, She reports her grandmother is also nauseous and has been for the past few days. The patient is able to keep liquid down. She does have frequent urination, but this is due to her drinking plenty of water. She denies hematuria and dysuria. She does have constipation, her last BM was yesterday, She took MiraLax once a day for her constipation. She has not had a BM nor has she taken MiraLax yet  today. She denies fever or diarrhea. She was suppose to train with her physical trainer 5 days ago but due to her emesis episode she cancelled.   Her last menstrual period was 03/23/17 which was normal. She still takes her birth control and contraception regularly. She drank alcohol, 2 beers, 2 days ago.     Patient Active Problem List   Diagnosis Date Noted  . OCP (oral contraceptive pills) initiation 04/08/2016  . Gross hematuria 04/08/2016  . Acne vulgaris 12/29/2015  . Health care maintenance 12/29/2015  . Seasonal allergies 08/27/2015  . MDD (major depressive disorder), single episode, severe , no psychosis (HCC) 04/12/2015  . Suicide attempt [facility](HCC)    Past Medical History:  Diagnosis Date  . Suicidal overdose (HCC)    04/07/2015   No past surgical history on file. No Known Allergies Prior to Admission medications   Medication Sig Start Date End Date Taking? Authorizing Provider  norgestimate-ethinyl estradiol (ORTHO-CYCLEN,SPRINTEC,PREVIFEM) 0.25-35 MG-MCG tablet Take 1 tablet by mouth daily. 02/17/17  Yes Myles LippsSantiago, Irma M, MD   Social History   Social History  . Marital status: Single    Spouse name: N/A  . Number of children: N/A  . Years of education: N/A   Occupational History  . Not on file.   Social History Main Topics  . Smoking status: Never Smoker  . Smokeless tobacco: Never Used  . Alcohol use 0.0 oz/week  . Drug use: No  . Sexual activity: No  Other Topics Concern  . Not on file   Social History Narrative  . No narrative on file      Review of Systems  Constitutional: Negative for fever.  Gastrointestinal: Positive for abdominal pain, constipation, nausea and vomiting. Negative for diarrhea.  Genitourinary: Negative for dysuria and hematuria.       Normal menstrual cycle, last on 03/23/17  All other systems reviewed and are negative.      Objective:   Physical Exam  Abdominal:  LLQ tender, minimal epigastric tenderness, minimal  tenderness at McBurney's and negative Murphy's, Negative heel jar.       Vitals:   03/28/17 0930  BP: 116/70  Pulse: 79  Resp: 18  Temp: 98.7 F (37.1 C)  TempSrc: Oral  SpO2: 98%  Weight: 127 lb 12.8 oz (58 kg)  Height: 5\' 1"  (1.549 m)   Wt Readings from Last 3 Encounters:  03/28/17 127 lb 12.8 oz (58 kg)  02/17/17 125 lb 6.4 oz (56.9 kg)  01/07/17 125 lb 3.2 oz (56.8 kg)   Body mass index is 24.15 kg/m.   Results for orders placed or performed in visit on 03/28/17  POCT CBC  Result Value Ref Range   WBC 5.3 4.6 - 10.2 K/uL   Lymph, poc 2.6 0.6 - 3.4   POC LYMPH PERCENT 49.9 10 - 50 %L   MID (cbc) 0.4 0 - 0.9   POC MID % 7.0 0 - 12 %M   POC Granulocyte 2.3 2 - 6.9   Granulocyte percent 43.1 37 - 80 %G   RBC 4.95 4.04 - 5.48 M/uL   Hemoglobin 14.5 12.2 - 16.2 g/dL   HCT, POC 86.5 78.4 - 47.9 %   MCV 87.3 80 - 97 fL   MCH, POC 29.3 27 - 31.2 pg   MCHC 33.6 31.8 - 35.4 g/dL   RDW, POC 69.6 %   Platelet Count, POC 305 142 - 424 K/uL   MPV 7.5 0 - 99.8 fL  POCT urine pregnancy  Result Value Ref Range   Preg Test, Ur Negative Negative  POCT urinalysis dipstick  Result Value Ref Range   Color, UA yellow yellow   Clarity, UA clear clear   Glucose, UA negative negative mg/dL   Bilirubin, UA negative negative   Ketones, POC UA negative negative mg/dL   Spec Grav, UA >=2.952 (A) 1.010 - 1.025   Blood, UA negative negative   pH, UA 6.0 5.0 - 8.0   Protein Ur, POC negative negative mg/dL   Urobilinogen, UA 0.2 0.2 or 1.0 E.U./dL   Nitrite, UA Negative Negative   Leukocytes, UA Negative Negative  POCT Microscopic Urinalysis ([facility])  Result Value Ref Range   WBC,UR,HPF,POC None None WBC/hpf   RBC,UR,HPF,POC None None RBC/hpf   Bacteria Many (A) None, Too numerous to count   Mucus Absent Absent   Epithelial Cells, UR Per Microscopy None None, Too numerous to count cells/hpf    No results found.      Assessment & Plan:     Amber Frey is a 21 y.o.  female Generalized abdominal pain - Plan: POCT CBC, Comprehensive metabolic panel, Lipase, POCT urine pregnancy, POCT urinalysis dipstick, POCT Microscopic Urinalysis ([facility])  Need for influenza vaccination - Plan: Flu Vaccine QUAD 36+ mos IM  Nausea and vomiting, intractability of vomiting not specified, unspecified vomiting type - Plan: POCT CBC, Lipase, ondansetron (ZOFRAN) 4 MG tablet  Constipation, unspecified constipation type  Initial single episode of emesis 5 days ago,  now with some residual cramping, nausea, and constipation. Reassuring CBC, urinalysis. Suspected viral illness and may be a component of constipation with abd cramping as well.   - check CMP for liver/GB source, and lipase for pancreas with recent nausea, but unlikely cause.   -bland foods, fluids, symptomatic care.   -miralax for constipation - increase to BID.   -zofran Rx given, but recommended not to use unless needed as may worsen constipation.   -rtc precautions.   Meds ordered this encounter  Medications  . ondansetron (ZOFRAN) 4 MG tablet    Sig: Take 1 tablet (4 mg total) by mouth every 8 (eight) hours as needed for nausea or vomiting.    Dispense:  10 tablet    Refill:  0   Patient Instructions   Blood counts and urine test reassuring. You may have a virus that should improve this week. Ok to use Zofran if needed for nausea. Make sure to drink fluids - small sips frequently as tolerated and continue bland food for now. Return to the clinic or go to the nearest emergency room if any of your symptoms worsen or new symptoms occur.  miralax twice per day for constipation until improving, then once per day or stop that med.   Return to the clinic or go to the nearest emergency room if any of your symptoms worsen or new symptoms occur.   Nausea and Vomiting, Adult Feeling sick to your stomach (nausea) means that your stomach is upset or you feel like you have to throw up (vomit). Feeling more and more sick  to your stomach can lead to throwing up. Throwing up happens when food and liquid from your stomach are thrown up and out the mouth. Throwing up can make you feel weak and cause you to get dehydrated. Dehydration can make you tired and thirsty, make you have a dry mouth, and make it so you pee (urinate) less often. Older adults and people with other diseases or a weak defense system (immune system) are at higher risk for dehydration. If you feel sick to your stomach or if you throw up, it is important to follow instructions from your doctor about how to take care of yourself. Follow these instructions at home: Eating and drinking Follow these instructions as told by your doctor:  Take an oral rehydration solution (ORS). This is a drink that is sold at pharmacies and stores.  Drink clear fluids in small amounts as you are able, such as: ? Water. ? Ice chips. ? Diluted fruit juice. ? Low-calorie sports drinks.  Eat bland, easy-to-digest foods in small amounts as you are able, such as: ? Bananas. ? Applesauce. ? Rice. ? Low-fat (lean) meats. ? Toast. ? Crackers.  Avoid fluids that have a lot of sugar or caffeine in them.  Avoid alcohol.  Avoid spicy or fatty foods.  General instructions  Drink enough fluid to keep your pee (urine) clear or pale yellow.  Wash your hands often. If you cannot use soap and water, use hand sanitizer.  Make sure that all people in your home wash their hands well and often.  Take over-the-counter and prescription medicines only as told by your doctor.  Rest at home while you get better.  Watch your condition for any changes.  Breathe slowly and deeply when you feel sick to your stomach.  Keep all follow-up visits as told by your doctor. This is important. Contact a doctor if:  You have a fever.  You cannot  keep fluids down.  Your symptoms get worse.  You have new symptoms.  You feel sick to your stomach for more than two days.  You feel  light-headed or dizzy.  You have a headache.  You have muscle cramps. Get help right away if:  You have pain in your chest, neck, arm, or jaw.  You feel very weak or you pass out (faint).  You throw up again and again.  You see blood in your throw-up.  Your throw-up looks like black coffee grounds.  You have bloody or black poop (stools) or poop that look like tar.  You have a very bad headache, a stiff neck, or both.  You have a rash.  You have very bad pain, cramping, or bloating in your belly (abdomen).  You have trouble breathing.  You are breathing very quickly.  Your heart is beating very quickly.  Your skin feels cold and clammy.  You feel confused.  You have pain when you pee.  You have signs of dehydration, such as: ? Dark pee, hardly any pee, or no pee. ? Cracked lips. ? Dry mouth. ? Sunken eyes. ? Sleepiness. ? Weakness. These symptoms may be an emergency. Do not wait to see if the symptoms will go away. Get medical help right away. Call your local emergency services (911 in the [place] Do not drive yourself to the hospital. This information is not intended to replace advice given to you by your health care provider. Make sure you discuss any questions you have with your health care provider. Document Released: 11/10/2007 Document Revised: 12/12/2015 Document Reviewed: 01/28/2015 Elsevier Interactive Patient Education  2018 [employer].  Constipation, Adult Constipation is when a person has fewer bowel movements in a week than normal, has difficulty having a bowel movement, or has stools that are dry, hard, or larger than normal. Constipation may be caused by an underlying condition. It may become worse with age if a person takes certain medicines and does not take in enough fluids. Follow these instructions at home: Eating and drinking   Eat foods that have a lot of fiber, such as fresh fruits and vegetables, whole grains, and beans.  Limit foods  that are high in fat, low in fiber, or overly processed, such as french fries, hamburgers, cookies, candies, and soda.  Drink enough fluid to keep your urine clear or pale yellow. General instructions  Exercise regularly or as told by your health care provider.  Go to the restroom when you have the urge to go. Do not hold it in.  Take over-the-counter and prescription medicines only as told by your health care provider. These include any fiber supplements.  Practice pelvic floor retraining exercises, such as deep breathing while relaxing the lower abdomen and pelvic floor relaxation during bowel movements.  Watch your condition for any changes.  Keep all follow-up visits as told by your health care provider. This is important. Contact a health care provider if:  You have pain that gets worse.  You have a fever.  You do not have a bowel movement after 4 days.  You vomit.  You are not hungry.  You lose weight.  You are bleeding from the anus.  You have thin, pencil-like stools. Get help right away if:  You have a fever and your symptoms suddenly get worse.  You leak stool or have blood in your stool.  Your abdomen is bloated.  You have severe pain in your abdomen.  You feel dizzy or  you faint. This information is not intended to replace advice given to you by your health care provider. Make sure you discuss any questions you have with your health care provider. Document Released: 02/20/2004 Document Revised: 12/12/2015 Document Reviewed: 11/12/2015 Elsevier Interactive Patient Education  2017 [employer].     IF you received an x-ray today, you will receive an invoice from [facility]. Please contact [facility] at [phone] with questions or concerns regarding your invoice.   IF you received labwork today, you will receive an invoice from [place]. Please contact [facility] at [phone] with questions or concerns regarding your invoice.    Our billing staff will not be able to assist you with questions regarding bills from these companies.  You will be contacted with the lab results as soon as they are available. The fastest way to get your results is to activate your My Chart account. Instructions are located on the last page of this paperwork. If you have not heard from [place] regarding the results in 2 weeks, please contact this office.       I personally performed the services described in this documentation, which was scribed in my presence. The recorded information has been reviewed and considered for accuracy and completeness, addended by me as needed, and agree with information above.  Signed,   Meredith Staggers, MD Primary Care at [facility].  03/28/17 11:54 AM

## 2017-03-28 NOTE — Patient Instructions (Addendum)
Blood counts and urine test reassuring. You may have a virus that should improve this week. Ok to use Zofran if needed for nausea. Make sure to drink fluids - small sips frequently as tolerated and continue bland food for now. Return to the clinic or go to the nearest emergency room if any of your symptoms worsen or new symptoms occur.  miralax twice per day for constipation until improving, then once per day or stop that med.   Return to the clinic or go to the nearest emergency room if any of your symptoms worsen or new symptoms occur.   Nausea and Vomiting, Adult Feeling sick to your stomach (nausea) means that your stomach is upset or you feel like you have to throw up (vomit). Feeling more and more sick to your stomach can lead to throwing up. Throwing up happens when food and liquid from your stomach are thrown up and out the mouth. Throwing up can make you feel weak and cause you to get dehydrated. Dehydration can make you tired and thirsty, make you have a dry mouth, and make it so you pee (urinate) less often. Older adults and people with other diseases or a weak defense system (immune system) are at higher risk for dehydration. If you feel sick to your stomach or if you throw up, it is important to follow instructions from your doctor about how to take care of yourself. Follow these instructions at home: Eating and drinking Follow these instructions as told by your doctor:  Take an oral rehydration solution (ORS). This is a drink that is sold at pharmacies and stores.  Drink clear fluids in small amounts as you are able, such as: ? Water. ? Ice chips. ? Diluted fruit juice. ? Low-calorie sports drinks.  Eat bland, easy-to-digest foods in small amounts as you are able, such as: ? Bananas. ? Applesauce. ? Rice. ? Low-fat (lean) meats. ? Toast. ? Crackers.  Avoid fluids that have a lot of sugar or caffeine in them.  Avoid alcohol.  Avoid spicy or fatty foods.  General  instructions  Drink enough fluid to keep your pee (urine) clear or pale yellow.  Wash your hands often. If you cannot use soap and water, use hand sanitizer.  Make sure that all people in your home wash their hands well and often.  Take over-the-counter and prescription medicines only as told by your doctor.  Rest at home while you get better.  Watch your condition for any changes.  Breathe slowly and deeply when you feel sick to your stomach.  Keep all follow-up visits as told by your doctor. This is important. Contact a doctor if:  You have a fever.  You cannot keep fluids down.  Your symptoms get worse.  You have new symptoms.  You feel sick to your stomach for more than two days.  You feel light-headed or dizzy.  You have a headache.  You have muscle cramps. Get help right away if:  You have pain in your chest, neck, arm, or jaw.  You feel very weak or you pass out (faint).  You throw up again and again.  You see blood in your throw-up.  Your throw-up looks like black coffee grounds.  You have bloody or black poop (stools) or poop that look like tar.  You have a very bad headache, a stiff neck, or both.  You have a rash.  You have very bad pain, cramping, or bloating in your belly (abdomen).  You have  trouble breathing.  You are breathing very quickly.  Your heart is beating very quickly.  Your skin feels cold and clammy.  You feel confused.  You have pain when you pee.  You have signs of dehydration, such as: ? Dark pee, hardly any pee, or no pee. ? Cracked lips. ? Dry mouth. ? Sunken eyes. ? Sleepiness. ? Weakness. These symptoms may be an emergency. Do not wait to see if the symptoms will go away. Get medical help right away. Call your local emergency services (911 in the [place] Do not drive yourself to the hospital. This information is not intended to replace advice given to you by your health care provider. Make sure you discuss any  questions you have with your health care provider. Document Released: 11/10/2007 Document Revised: 12/12/2015 Document Reviewed: 01/28/2015 Elsevier Interactive Patient Education  2018 [employer].  Constipation, Adult Constipation is when a person has fewer bowel movements in a week than normal, has difficulty having a bowel movement, or has stools that are dry, hard, or larger than normal. Constipation may be caused by an underlying condition. It may become worse with age if a person takes certain medicines and does not take in enough fluids. Follow these instructions at home: Eating and drinking   Eat foods that have a lot of fiber, such as fresh fruits and vegetables, whole grains, and beans.  Limit foods that are high in fat, low in fiber, or overly processed, such as french fries, hamburgers, cookies, candies, and soda.  Drink enough fluid to keep your urine clear or pale yellow. General instructions  Exercise regularly or as told by your health care provider.  Go to the restroom when you have the urge to go. Do not hold it in.  Take over-the-counter and prescription medicines only as told by your health care provider. These include any fiber supplements.  Practice pelvic floor retraining exercises, such as deep breathing while relaxing the lower abdomen and pelvic floor relaxation during bowel movements.  Watch your condition for any changes.  Keep all follow-up visits as told by your health care provider. This is important. Contact a health care provider if:  You have pain that gets worse.  You have a fever.  You do not have a bowel movement after 4 days.  You vomit.  You are not hungry.  You lose weight.  You are bleeding from the anus.  You have thin, pencil-like stools. Get help right away if:  You have a fever and your symptoms suddenly get worse.  You leak stool or have blood in your stool.  Your abdomen is bloated.  You have severe pain in your  abdomen.  You feel dizzy or you faint. This information is not intended to replace advice given to you by your health care provider. Make sure you discuss any questions you have with your health care provider. Document Released: 02/20/2004 Document Revised: 12/12/2015 Document Reviewed: 11/12/2015 Elsevier Interactive Patient Education  2017 [employer].     IF you received an x-ray today, you will receive an invoice from [facility]. Please contact [facility] at [phone] with questions or concerns regarding your invoice.   IF you received labwork today, you will receive an invoice from [place]. Please contact [facility] at [phone] with questions or concerns regarding your invoice.   Our billing staff will not be able to assist you with questions regarding bills from these companies.  You will be contacted with the lab results as soon as  they are available. The fastest way to get your results is to activate your My Chart account. Instructions are located on the last page of this paperwork. If you have not heard from [place] regarding the results in 2 weeks, please contact this office.

## 2017-03-29 LAB — COMPREHENSIVE METABOLIC PANEL
ALK PHOS: 63 IU/L (ref 39–117)
ALT: 29 IU/L (ref 0–32)
AST: 22 IU/L (ref 0–40)
Albumin/Globulin Ratio: 1.8 (ref 1.2–2.2)
Albumin: 5.1 g/dL (ref 3.5–5.5)
BILIRUBIN TOTAL: 0.6 mg/dL (ref 0.0–1.2)
BUN / CREAT RATIO: 19 (ref 9–23)
BUN: 15 mg/dL (ref 6–20)
CO2: 21 mmol/L (ref 20–29)
CREATININE: 0.78 mg/dL (ref 0.57–1.00)
Calcium: 10.2 mg/dL (ref 8.7–10.2)
Chloride: 102 mmol/L (ref 96–106)
GFR calc Af Amer: 124 mL/min/{1.73_m2} (ref >59)
GFR calc non Af Amer: 107 mL/min/{1.73_m2} (ref >59)
GLUCOSE: 90 mg/dL (ref 65–99)
Globulin, Total: 2.8 g/dL (ref 1.5–4.5)
POTASSIUM: 4.8 mmol/L (ref 3.5–5.2)
SODIUM: 139 mmol/L (ref 134–144)
Total Protein: 7.9 g/dL (ref 6.0–8.5)

## 2017-03-29 LAB — LIPASE: LIPASE: 31 U/L (ref 14–72)

## 2017-04-11 ENCOUNTER — Encounter: Payer: Self-pay | Admitting: *Deleted
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
## 2017-07-04 IMAGING — DX DG CHEST 1V PORT
1 series · 1 of 1 positions shown · non-contrast
Comparison: 04/08/2015

CLINICAL DATA: Central line placement.

EXAM:
PORTABLE CHEST 1 VIEW

[chest ap]
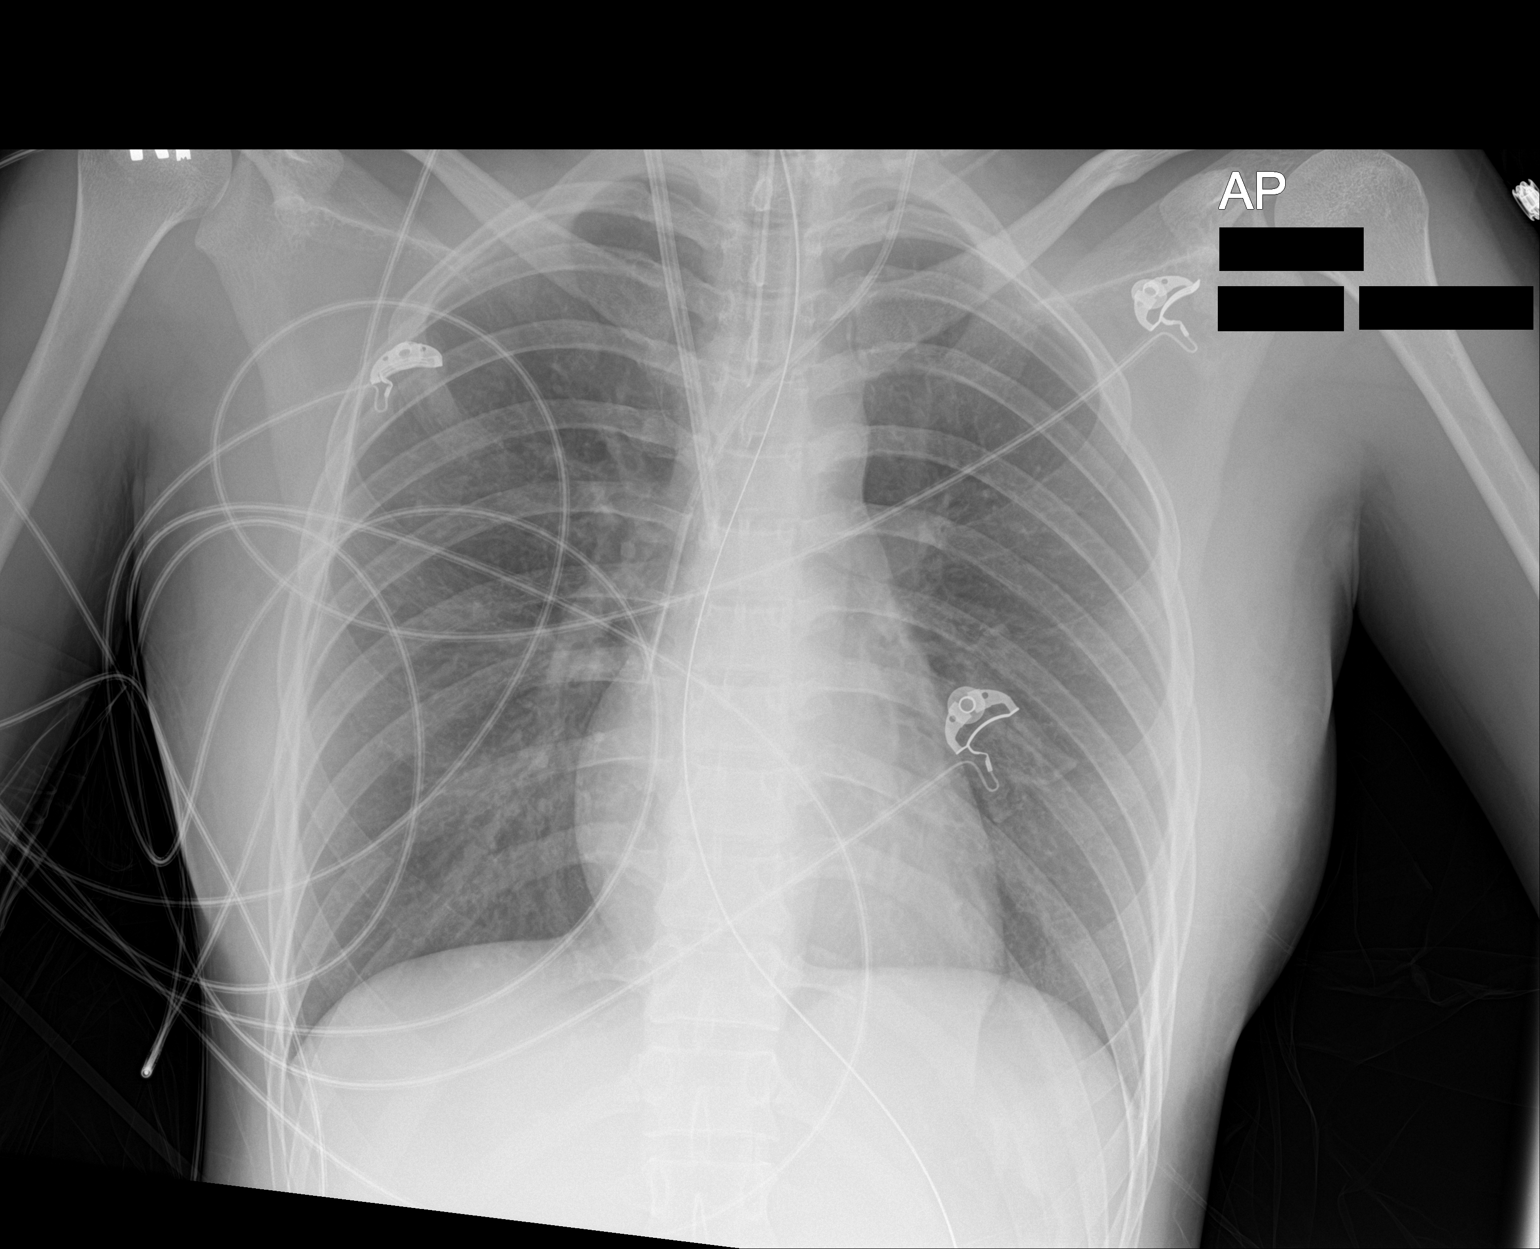

[1 of 1 positions shown; findings below may reference images not displayed]

FINDINGS: Left internal jugular central line has been placed with the tip at
the cavoatrial junction. No pneumothorax. Right central line,
endotracheal tube and NG tube are unchanged. Lungs are clear. Heart
is normal size. No effusions.
IMPRESSION: Left central line placement. Tip is at the cavoatrial junction. No
pneumothorax.

No active disease.

## 2017-08-10 ENCOUNTER — Other Ambulatory Visit: Payer: Self-pay

## 2017-08-10 ENCOUNTER — Encounter: Payer: Self-pay | Admitting: Physician Assistant

## 2017-08-10 ENCOUNTER — Ambulatory Visit: Payer: BLUE CROSS/BLUE SHIELD | Admitting: Physician Assistant

## 2017-08-10 VITALS — BP 116/79 | HR 72 | Temp 98.1°F | Resp 16 | Ht 62.0 in | Wt 127.2 lb

## 2017-08-10 DIAGNOSIS — R109 Unspecified abdominal pain: Secondary | ICD-10-CM | POA: Diagnosis not present

## 2017-08-10 LAB — CMP14+EGFR
ALT: 50 IU/L — ABNORMAL HIGH (ref 0–32)
AST: 32 IU/L (ref 0–40)
Albumin/Globulin Ratio: 1.6 (ref 1.2–2.2)
Albumin: 4.1 g/dL (ref 3.5–5.5)
Alkaline Phosphatase: 57 IU/L (ref 39–117)
BUN/Creatinine Ratio: 14 (ref 9–23)
BUN: 10 mg/dL (ref 6–20)
Bilirubin Total: <0.2 mg/dL (ref 0.0–1.2)
CO2: 22 mmol/L (ref 20–29)
Calcium: 9.2 mg/dL (ref 8.7–10.2)
Chloride: 103 mmol/L (ref 96–106)
Creatinine, Ser: 0.69 mg/dL (ref 0.57–1.00)
GFR calc Af Amer: 142 mL/min/{1.73_m2} (ref >59)
GFR calc non Af Amer: 123 mL/min/{1.73_m2} (ref >59)
Globulin, Total: 2.6 g/dL (ref 1.5–4.5)
Glucose: 80 mg/dL (ref 65–99)
Potassium: 4.2 mmol/L (ref 3.5–5.2)
Sodium: 141 mmol/L (ref 134–144)
Total Protein: 6.7 g/dL (ref 6.0–8.5)

## 2017-08-10 LAB — CBC
Hematocrit: 41.2 % (ref 34.0–46.6)
Hemoglobin: 13.3 g/dL (ref 11.1–15.9)
MCH: 29 pg (ref 26.6–33.0)
MCHC: 32.3 g/dL (ref 31.5–35.7)
MCV: 90 fL (ref 79–97)
Platelets: 371 10*3/uL (ref 150–379)
RBC: 4.58 x10E6/uL (ref 3.77–5.28)
RDW: 13.1 % (ref 12.3–15.4)
WBC: 8.1 10*3/uL (ref 3.4–10.8)

## 2017-08-10 NOTE — Progress Notes (Signed)
   Amber Frey  MRN: [id number] DOB: Aug 28, 1995  PCP: Patient, No Pcp Per  Subjective:  Pt is a 22 year old female PMH sever depression with a prior suicide attempt who presents to clinic for f/u diarrhea and vomiting.  She was seen 1 week ago in [place] for diarrhea and diagnosed with fatty liver disease and gastroenteritis. She was prescribed medications which she believes were omeprazole and an antidiarrheal.  Labs were done in [place], however she did not bring these with her today.  She is feeling much better. Last episode of vomiting 8 days ago. Last episode of diarrhea was 6 days ago.   Review of Systems  Constitutional: Negative for chills, diaphoresis, fatigue and fever.  Gastrointestinal: Positive for abdominal pain, diarrhea and vomiting. Negative for blood in stool, constipation and nausea.    Patient Active Problem List   Diagnosis Date Noted  . OCP (oral contraceptive pills) initiation 04/08/2016  . Gross hematuria 04/08/2016  . Acne vulgaris 12/29/2015  . Health care maintenance 12/29/2015  . Seasonal allergies 08/27/2015  . MDD (major depressive disorder), single episode, severe , no psychosis ([place]) 04/12/2015  . Suicide attempt [facility])     Current Outpatient Medications on File Prior to Visit  Medication Sig Dispense Refill  . norgestimate-ethinyl estradiol (ORTHO-CYCLEN,SPRINTEC,PREVIFEM) 0.25-35 MG-MCG tablet Take 1 tablet by mouth daily. 1 Package 11  . ondansetron (ZOFRAN) 4 MG tablet Take 1 tablet (4 mg total) by mouth every 8 (eight) hours as needed for nausea or vomiting. (Patient not taking: Reported on 08/10/2017) 10 tablet 0   No current facility-administered medications on file prior to visit.     No Known Allergies   Objective:  BP 116/79   Pulse 72   Temp 98.1 F (36.7 C) (Oral)   Resp 16   Ht '5\' 2"'$  (1.575 m)   Wt 127 lb 3.2 oz (57.7 kg)   LMP 06/10/2017 Comment: per pt "I have irregular periods"  SpO2 99%   BMI 23.27 kg/m   Physical Exam    Constitutional: She is oriented to person, place, and time and well-developed, well-nourished, and in no distress. No distress.  Cardiovascular: Normal rate, regular rhythm and normal heart sounds.  Abdominal: Soft. Normal appearance and bowel sounds are normal. There is tenderness in the epigastric area. There is no tenderness at McBurney's point and negative Murphy's sign.  Neurological: She is alert and oriented to person, place, and time. GCS score is 15.  Skin: Skin is warm and dry.  Psychiatric: Mood, memory, affect and judgment normal.  Vitals reviewed.   Assessment and Plan :  1. Abdominal pain, unspecified abdominal location - CMP14+EGFR - CBC -  Pt presents for f/u diarrhea and vomiting. She was seen 1 week ago in [place] and diagnosed with fatty liver disease and gastroenteritis (per pt). She did not bring paperwork from her visit. She is feeling much better. Last episode of vomiting 8 days ago. Last episode of diarrhea was 6 days ago. No concerning findings on PE. Plan to check liver enzymes and will contact with results.   Mercer Pod, PA-C  Primary Care at [place] 08/10/2017 11:04 AM

## 2017-08-10 NOTE — Patient Instructions (Addendum)
I am glad your diarrhea is improving. Please see food choices below. Stick to these foods until your abdominal pain has gotten better. Stay well hydrated - drink at least 32 oz water/daily.  We will contact you with the results of your lab work when they come back. My suspicion of you having fatty liver disease is very low.   Thank you for coming in today. I hope you feel we met your needs.  Feel free to call PCP if you have any questions or further requests.  Please consider signing up for MyChart if you do not already have it, as this is a great way to communicate with me.  Best,  Whitney McVey, PA-C   Food Choices to Help Relieve Diarrhea, Adult When you have diarrhea, the foods you eat and your eating habits are very important. Choosing the right foods and drinks can help:  Relieve diarrhea.  Replace lost fluids and nutrients.  Prevent dehydration.  What general guidelines should I follow? Relieving diarrhea  Choose foods with less than 2 g or .07 oz. of fiber per serving.  Limit fats to less than 8 tsp (38 g or 1.34 oz.) a day.  Avoid the following: ? Foods and beverages sweetened with high-fructose corn syrup, honey, or sugar alcohols such as xylitol, sorbitol, and mannitol. ? Foods that contain a lot of fat or sugar. ? Fried, greasy, or spicy foods. ? High-fiber grains, breads, and cereals. ? Raw fruits and vegetables.  Eat foods that are rich in probiotics. These foods include dairy products such as yogurt and fermented milk products. They help increase healthy bacteria in the stomach and intestines (gastrointestinal tract, or GI tract).  If you have lactose intolerance, avoid dairy products. These may make your diarrhea worse.  Take medicine to help stop diarrhea (antidiarrheal medicine) only as told by your health care provider. Replacing nutrients  Eat small meals or snacks every 3-4 hours.  Eat bland foods, such as white rice, toast, or baked potato, until your  diarrhea starts to get better. Gradually reintroduce nutrient-rich foods as tolerated or as told by your health care provider. This includes: ? Well-cooked protein foods. ? Peeled, seeded, and soft-cooked fruits and vegetables. ? Low-fat dairy products.  Take vitamin and mineral supplements as told by your health care provider. Preventing dehydration   Start by sipping water or a special solution to prevent dehydration (oral rehydration solution, ORS). Urine that is clear or pale yellow means that you are getting enough fluid.  Try to drink at least 8-10 cups of fluid each day to help replace lost fluids.  You may add other liquids in addition to water, such as clear juice or decaffeinated sports drinks, as tolerated or as told by your health care provider.  Avoid drinks with caffeine, such as coffee, tea, or soft drinks.  Avoid alcohol. What foods are recommended? The items listed may not be a complete list. Talk with your health care provider about what dietary choices are best for you. Grains White rice. White, [place], or pita breads (fresh or toasted), including plain rolls, buns, or bagels. White pasta. Saltine, soda, or graham crackers. Pretzels. Low-fiber cereal. Cooked cereals made with water (such as cornmeal, farina, or cream cereals). Plain muffins. Matzo. Melba toast. Zwieback. Vegetables Potatoes (without the skin). Most well-cooked and canned vegetables without skins or seeds. Tender lettuce. Fruits Apple sauce. Fruits canned in juice. Cooked apricots, cherries, grapefruit, peaches, pears, or plums. Fresh bananas and cantaloupe. Meats and other protein  foods Baked or boiled chicken. Eggs. Tofu. Fish. Seafood. Smooth nut butters. Ground or well-cooked tender beef, ham, veal, lamb, pork, or poultry. Dairy Plain yogurt, kefir, and unsweetened liquid yogurt. Lactose-free milk, buttermilk, skim milk, or soy milk. Low-fat or nonfat hard cheese. Beverages Water. Low-calorie  sports drinks. Fruit juices without pulp. Strained tomato and vegetable juices. Decaffeinated teas. Sugar-free beverages not sweetened with sugar alcohols. Oral rehydration solutions, if approved by your health care provider. Seasoning and other foods Bouillon, broth, or soups made from recommended foods. What foods are not recommended? The items listed may not be a complete list. Talk with your health care provider about what dietary choices are best for you. Grains Whole grain, whole wheat, bran, or rye breads, rolls, pastas, and crackers. Wild or brown rice. Whole grain or bran cereals. Barley. Oats and oatmeal. Corn tortillas or taco shells. Granola. Popcorn. Vegetables Raw vegetables. Fried vegetables. Cabbage, broccoli, Brussels sprouts, artichokes, baked beans, beet greens, corn, kale, legumes, peas, sweet potatoes, and yams. Potato skins. Cooked spinach and cabbage. Fruits Dried fruit, including raisins and dates. Raw fruits. Stewed or dried prunes. Canned fruits with syrup. Meat and other protein foods Fried or fatty meats. Deli meats. Chunky nut butters. Nuts and seeds. Beans and lentils. Berniece Salines. Hot dogs. Sausage. Dairy High-fat cheeses. Whole milk, chocolate milk, and beverages made with milk, such as milk shakes. Half-and-half. Cream. sour cream. Ice cream. Beverages Caffeinated beverages (such as coffee, tea, soda, or energy drinks). Alcoholic beverages. Fruit juices with pulp. Prune juice. Soft drinks sweetened with high-fructose corn syrup or sugar alcohols. High-calorie sports drinks. Fats and oils Butter. Cream sauces. Margarine. Salad oils. Plain salad dressings. Olives. Avocados. Mayonnaise. Sweets and desserts Sweet rolls, doughnuts, and sweet breads. Sugar-free desserts sweetened with sugar alcohols such as xylitol and sorbitol. Seasoning and other foods Honey. Hot sauce. Chili powder. Gravy. Cream-based or milk-based soups. Pancakes and waffles. Summary  When you have  diarrhea, the foods you eat and your eating habits are very important.  Make sure you get at least 8-10 cups of fluid each day, or enough to keep your urine clear or pale yellow.  Eat bland foods and gradually reintroduce healthy, nutrient-rich foods as tolerated, or as told by your health care provider.  Avoid high-fiber, fried, greasy, or spicy foods. This information is not intended to replace advice given to you by your health care provider. Make sure you discuss any questions you have with your health care provider. Document Released: 08/14/2003 Document Revised: 05/21/2016 Document Reviewed: 05/21/2016 Elsevier Interactive Patient Education  2018 [employer].   IF you received an x-ray today, you will receive an invoice from [facility]. Please contact [facility] at [phone] with questions or concerns regarding your invoice.   IF you received labwork today, you will receive an invoice from [place]. Please contact [facility] at [phone] with questions or concerns regarding your invoice.   Our billing staff will not be able to assist you with questions regarding bills from these companies.  You will be contacted with the lab results as soon as they are available. The fastest way to get your results is to activate your My Chart account. Instructions are located on the last page of this paperwork. If you have not heard from [place] regarding the results in 2 weeks, please contact this office.

## 2017-08-17 ENCOUNTER — Encounter: Payer: Self-pay | Admitting: Physician Assistant

## 2017-08-17 NOTE — Progress Notes (Signed)
Result letter sent in mail.

## 2017-11-10 ENCOUNTER — Ambulatory Visit: Payer: Self-pay

## 2017-11-10 NOTE — Telephone Encounter (Signed)
Pt. called to report rash on her left elbow, left wrist, left ear, on face a few inches from lips, and left thigh.  Denied any rash,  Soreness, or swelling of mouth, tongue, or throat.  Described the rash as "red raised areas in a circular appearance."  The onset was about one month ago, and started on the left elbow, then has spread to the other areas.  C/o intermittent itching. Reported one site, on left wrist, "is oozing a clear to yellow fluid."  Describes cracking of skin in some of the areas of rash.  Stated the red color of the rash has turned to a darker red in some of the areas.  Has tried Vaseline, Hydrocortisone cream, and Lamisil to the affected areas.  Denied fever.  Stated she felt like she had a fever about 2 weeks ago, but no recurrence.  Denied any other ill feeling. Stated she is unable to get to an appt. Today; appt. given at 8:00 AM 6/7.  Care advice given per protocol.  Verb. Understanding; agrees with plan.              Reason for Disposition . Ring-like appearance of rash (or ask: does it look like a  "target" or "bulls- eye")  Answer Assessment - Initial Assessment Questions 1. APPEARANCE of RASH: "Describe the rash." (e.g., spots, blisters, raised areas, skin peeling, scaly)     Rash started on left elbow in small area; now on wrist 2. SIZE: "How big are the spots?" (e.g., tip of pen, eraser, coin; inches, centimeters)     Small blotches with red raised spots that are starting to crack ; circular formation  3. LOCATION: "Where is the rash located?"     Left wrist, elbow, face near lip, left ear, left thigh    4. COLOR: "What color is the rash?" (Note: It is difficult to assess rash color in people with darker-colored skin. When this situation occurs, simply ask the caller to describe what they see.)     Rash looks red, raised, and then turns to darker red 5. ONSET: "When did the rash begin?"     About one month 6. FEVER: "Do you have a fever?" If so, ask: "What is your  temperature, how was it measured, and when did it start?"     Felt feverish about 2 weeks ago; no further fever 7. ITCHING: "Does the rash itch?" If so, ask: "How bad is the itch?" (Scale 1-10; or mild, moderate, severe)     C/o itching at times; 8/10 at times 8. CAUSE: "What do you think is causing the rash?"     unknown 9. MEDICATION FACTORS: "Have you started any new medications within the last 2 weeks?" (e.g., antibiotics)      None 10. OTHER SYMPTOMS: "Do you have any other symptoms?" (e.g., dizziness, headache, sore throat, joint pain)      Denied the above sx's 11. PREGNANCY: "Is there any chance you are pregnant?" "When was your last menstrual period?"       No; LMP 2 days ago  Protocols used: RASH OR REDNESS - [facility]

## 2017-11-11 ENCOUNTER — Encounter: Payer: Self-pay | Admitting: Physician Assistant

## 2017-11-11 ENCOUNTER — Ambulatory Visit: Payer: BLUE CROSS/BLUE SHIELD | Admitting: Physician Assistant

## 2017-11-11 ENCOUNTER — Other Ambulatory Visit: Payer: Self-pay

## 2017-11-11 VITALS — BP 100/62 | HR 71 | Temp 98.1°F | Resp 16 | Ht 62.0 in | Wt 124.0 lb

## 2017-11-11 DIAGNOSIS — R21 Rash and other nonspecific skin eruption: Secondary | ICD-10-CM | POA: Diagnosis not present

## 2017-11-11 DIAGNOSIS — B354 Tinea corporis: Secondary | ICD-10-CM

## 2017-11-11 LAB — POCT SKIN KOH: Skin KOH, POC: NEGATIVE

## 2017-11-11 MED ORDER — TERBINAFINE HCL 1 % EX CREA: 1.0000 "application " | TOPICAL_CREAM | Freq: Two times a day (BID) | CUTANEOUS | 0 refills | Status: AC

## 2017-11-11 NOTE — Patient Instructions (Addendum)
I am treating you today for tinea corporis (see below)  Apply Terbinafine cream to the affected areas 1-2 times daily for 3-4 weeks. (You may have to use it longer. So, apply cream until your rash resolves, then continue applying it for one more week) Come back and see me in 1 week to make sure your rash is resolving.    Body Ringworm Body ringworm is an infection of the skin that often causes a ring-shaped rash. Body ringworm can affect any part of your skin. It can spread easily to others. Body ringworm is also called tinea corporis. What are the causes? This condition is caused by funguses called dermatophytes. The condition develops when these funguses grow out of control on the skin. You can get this condition if you touch a person or animal that has it. You can also get it if you share clothing, bedding, towels, or any other object with an infected person or pet. What increases the risk? This condition is more likely to develop in:  Athletes who often make skin-to-skin contact with other athletes, such as wrestlers.  People who share equipment and mats.  People with a weakened immune system.  What are the signs or symptoms? Symptoms of this condition include:  Itchy, raised red spots and bumps.  Red scaly patches.  A ring-shaped rash. The rash may have: ? A clear center. ? Scales or red bumps at its center. ? Redness near its borders. ? Dry and scaly skin on or around it.  How is this diagnosed? This condition can usually be diagnosed with a skin exam. A skin scraping may be taken from the affected area and examined under a microscope to see if the fungus is present. How is this treated? This condition may be treated with:  An antifungal cream or ointment.  An antifungal shampoo.  Antifungal medicines. These may be prescribed if your ringworm is severe, keeps coming back, or lasts a long time.  Follow these instructions at home:  Take over-the-counter and  prescription medicines only as told by your health care provider.  If you were given an antifungal cream or ointment: ? Use it as told by your health care provider. ? Wash the infected area and dry it completely before applying the cream or ointment.  If you were given an antifungal shampoo: ? Use it as told by your health care provider. ? Leave the shampoo on your body for 3-5 minutes before rinsing.  While you have a rash: ? Wear loose clothing to stop clothes from rubbing and irritating it. ? Wash or change your bed sheets every night.  If your pet has the same infection, take your pet to see a International aid/development worker. How is this prevented?  Practice good hygiene.  Wear sandals or shoes in public places and showers.  Do not share personal items with others.  Avoid touching red patches of skin on other people.  Avoid touching pets that have bald spots.  If you touch an animal that has a bald spot, wash your hands. Contact a health care provider if:  Your rash continues to spread after 7 days of treatment.  Your rash is not gone in 4 weeks.  The area around your rash gets red, warm, tender, and swollen. This information is not intended to replace advice given to you by your health care provider. Make sure you discuss any questions you have with your health care provider. Document Released: 05/21/2000 Document Revised: 10/30/2015 Document Reviewed: 03/20/2015 Elsevier Interactive  Patient Education  [employer]2018 Elsevier Inc.  IF you received an x-ray today, you will receive an invoice from [facility]. Please contact [facility] at [phone] with questions or concerns regarding your invoice.   IF you received labwork today, you will receive an invoice from [facility]. Please contact [facility] at [phone] with questions or concerns regarding your invoice.   Our billing staff will not be able to assist you with questions regarding bills from these companies.  You will  be contacted with the lab results as soon as they are available. The fastest way to get your results is to activate your My Chart account. Instructions are located on the last page of this paperwork. If you have not heard from us regarding the results in 2 weeks, please contact this office.

## 2017-11-11 NOTE — Progress Notes (Signed)
   Westly Pamrang Morriss  MRN: [id number] DOB: 02/24/1996  PCP: Patient, No Pcp Per  Subjective:  Pt is a 22 year old female who presents to clinic for rash x 3 months.  Rash started as two small 1cm scaly spots on her anterior thigh.  Later started to develop on her right dorsal hand with three larger scaly patches. Now she c/o 2-3 areas of the same kind on the left side of her face. Endorses itching.   Review of Systems  Patient Active Problem List   Diagnosis Date Noted  . OCP (oral contraceptive pills) initiation 04/08/2016  . Gross hematuria 04/08/2016  . Acne vulgaris 12/29/2015  . Health care maintenance 12/29/2015  . Seasonal allergies 08/27/2015  . MDD (major depressive disorder), single episode, severe , no psychosis ([facility]) 04/12/2015  . Suicide attempt [facility])     Current Outpatient Medications on File Prior to Visit  Medication Sig Dispense Refill  . norgestimate-ethinyl estradiol (ORTHO-CYCLEN,SPRINTEC,PREVIFEM) 0.25-35 MG-MCG tablet Take 1 tablet by mouth daily. 1 Package 11  . ondansetron (ZOFRAN) 4 MG tablet Take 1 tablet (4 mg total) by mouth every 8 (eight) hours as needed for nausea or vomiting. (Patient not taking: Reported on 08/10/2017) 10 tablet 0   No current facility-administered medications on file prior to visit.     No Known Allergies   Objective:  BP 100/62 (BP Location: Right Arm, Patient Position: Sitting, Cuff Size: Normal)   Pulse 71   Temp 98.1 F (36.7 C) (Oral)   Resp 16   Ht 5\' 2"  (1.575 m)   Wt 124 lb (56.2 kg)   LMP 11/08/2017   SpO2 97%   BMI 22.68 kg/m   Physical Exam  Constitutional: She is oriented to person, place, and time. No distress.  Neurological: She is alert and oriented to person, place, and time.  Skin: Skin is warm and dry. Rash noted. Rash is macular.     Scaly macular rash on mildly erythematous base with central clearing.   Psychiatric: Judgment normal.  Vitals reviewed.     Results for orders placed or performed in  visit on 11/11/17  POCT Skin KOH  Result Value Ref Range   Skin KOH, POC Negative Negative   Assessment and Plan :  1. Rash and nonspecific skin eruption - Pt presents with rash x 3 months. Suspect tinea corporis despite negative skin scraping, plan to treat topically x 4 weeks. RTC in 1 week if symptoms are worsening or fail to improve.   - POCT Skin KOH  2. Tinea corporis - terbinafine (LAMISIL) 1 % cream; Apply 1 application topically 2 (two) times daily. Apply daily for 3-4 weeks, or until rash resolves.  Dispense: 30 g; Refill: 0    Whitney Yafet Cline, PA-C  [facility] 11/11/2017 8:09 AM

## 2017-12-06 ENCOUNTER — Other Ambulatory Visit: Payer: Self-pay | Admitting: Family Medicine

## 2017-12-06 NOTE — Telephone Encounter (Signed)
Refill request Sprintec  LOV 11-11-2017 with Hulen ShoutsElizabeth Frey  for rash  Last Filled  02-17-2017 1 package  - 1 tab daily  11 refills Dr Leretha PolSantiago   [facility] [employer]

## 2017-12-14 ENCOUNTER — Ambulatory Visit: Payer: Self-pay | Admitting: *Deleted

## 2017-12-14 NOTE — Telephone Encounter (Signed)
Pt reports intermittent abdominal pain, right above umbilicus, does not radiate. States onset 1 week ago, worsening past 3 days. Intermittent, lasts 2-3 minutes. Seen 08/10/17 for similar symptoms after traveling to [place]. Denies nausea, vomiting, fever. States BMs WNL. States pain is worse before and after eating. Also reports occasional nose bleed x 1 month, more frequent recently, 2xs daily; quickly resolves, right nare only. Attempted to secure appt for today and over next several days, pt unable to make any appt offered due to family obligations and work, declines other practice. Care advise given per protocol. Directed pt to UC. States she will follow disposition.  Reason for Disposition . [1] MODERATE pain (e.g., interferes with normal activities) AND [2] pain comes and goes (cramps) AND [3] present > 24 hours  (Exception: pain with Vomiting or Diarrhea - see that Guideline)  Answer Assessment - Initial Assessment Questions 1. LOCATION: "Where does it hurt?"      Mid stomach, right above umbilicus 2. RADIATION: "Does the pain shoot anywhere else?" (e.g., chest, back)     no 3. ONSET: "When did the pain begin?" (e.g., minutes, hours or days ago)      1 week ago, worsening past 3 days 4. SUDDEN: "Gradual or sudden onset?"     gradual 5. PATTERN "Does the pain come and go, or is it constant?"    - If constant: "Is it getting better, staying the same, or worsening?"      (Note: Constant means the pain never goes away completely; most serious pain is constant and it progresses)     - If intermittent: "How long does it last?" "Do you have pain now?"     (Note: Intermittent means the pain goes away completely between bouts)    Intermittent 6. SEVERITY: "How bad is the pain?"  (e.g., Scale 1-10; mild, moderate, or severe)   - MILD (1-3): doesn't interfere with normal activities, abdomen soft and not tender to touch    - MODERATE (4-7): interferes with normal activities or awakens from sleep,  tender to touch    - SEVERE (8-10): excruciating pain, doubled over, unable to do any normal activities      8/10 7. RECURRENT SYMPTOM: "Have you ever had this type of abdominal pain before?" If so, ask: "When was the last time?" and "What happened that time?"      No 8. CAUSE: "What do you think is causing the abdominal pain?"     Unsure 9. RELIEVING/AGGRAVATING FACTORS: "What makes it better or worse?" (e.g., movement, antacids, bowel movement)     no 10. OTHER SYMPTOMS: "Has there been any vomiting, diarrhea, constipation, or urine problems?"       no 11. PREGNANCY: "Is there any chance you are pregnant?" "When was your last menstrual period?"       No Last menses 2 days ago, nose bleeds x 1 month, more frequent  Protocols used: [facility]BDOMINAL PAIN - [facility]

## 2017-12-15 NOTE — Telephone Encounter (Signed)
Patient was advised to go to Urgent Care by triage nurse

## 2017-12-15 NOTE — Telephone Encounter (Signed)
Message sent to PED scheduling pool to call pt.

## 2018-02-28 ENCOUNTER — Other Ambulatory Visit: Payer: Self-pay | Admitting: Family Medicine

## 2018-05-20 ENCOUNTER — Other Ambulatory Visit: Payer: Self-pay | Admitting: Family Medicine

## 2018-08-07 ENCOUNTER — Other Ambulatory Visit: Payer: Self-pay | Admitting: Family Medicine

## 2018-08-08 NOTE — Telephone Encounter (Signed)
Requested Prescriptions  Pending Prescriptions Disp Refills  . SPRINTEC 28 0.25-35 MG-MCG tablet [Pharmacy Med Name: SPRINTEC 28 DAY TABLET] 84 tablet 0    Sig: TAKE 1 TABLET BY MOUTH EVERY DAY     OB/GYN:  Contraceptives Passed - 08/07/2018  1:15 PM      Passed - Last BP in normal range    BP Readings from Last 1 Encounters:  11/11/17 100/62         Passed - Valid encounter within last 12 months    Recent Outpatient Visits          9 months ago Rash and nonspecific skin eruption   Primary Care at [facility], [place], PA-C   12 months ago Abdominal pain, unspecified abdominal location   Primary Care at [facility], Madelaine Bhat, PA-C   1 year ago Generalized abdominal pain   Primary Care at [facility], Asencion Partridge, MD   1 year ago Encounter for other contraceptive management   [facility] Care at [facility], Meda Coffee, MD   1 year ago Encounter for surveillance of injectable contraceptive   Primary Care at [facility], [place], [place]

## 2018-10-24 ENCOUNTER — Other Ambulatory Visit: Payer: Self-pay | Admitting: Family Medicine

## 2019-01-16 ENCOUNTER — Other Ambulatory Visit: Payer: Self-pay | Admitting: Family Medicine

## 2019-01-16 NOTE — Telephone Encounter (Signed)
Forwarding medication refill request to clinical pool for review. 

## 2022-06-27 ENCOUNTER — Ambulatory Visit
Admission: EM | Admit: 2022-06-27 | Discharge: 2022-06-27 | Disposition: A | Discharge status: Home / Self Care | Payer: BLUE CROSS/BLUE SHIELD | Attending: Physician Assistant | Admitting: Physician Assistant

## 2022-06-27 DIAGNOSIS — J069 Acute upper respiratory infection, unspecified: Secondary | ICD-10-CM | POA: Diagnosis present

## 2022-06-27 DIAGNOSIS — R6889 Other general symptoms and signs: Secondary | ICD-10-CM | POA: Diagnosis not present

## 2022-06-27 DIAGNOSIS — Z1152 Encounter for screening for COVID-19: Secondary | ICD-10-CM | POA: Diagnosis not present

## 2022-06-27 MED ORDER — OSELTAMIVIR PHOSPHATE 75 MG PO CAPS: 75.0000 mg | ORAL_CAPSULE | Freq: Two times a day (BID) | ORAL | 0 refills | Status: AC

## 2022-06-27 NOTE — ED Provider Notes (Signed)
[facility]    CSN: [id number] Arrival date & time: 06/27/22  0802      History   Chief Complaint Chief Complaint  Patient presents with   Cough    HPI Amber Frey is a 27 y.o. female.   Patient here today for evaluation of cough, congestion, body aches, and fever. She states symptoms started 2 days ago. She has had some vomiting but no diarrhea. She has tried dayquil with mild relief.   The history is provided by the patient.    Past Medical History:  Diagnosis Date   Suicidal overdose ([place])    04/07/2015    Patient Active Problem List   Diagnosis Date Noted   OCP (oral contraceptive pills) initiation 04/08/2016   Gross hematuria 04/08/2016   Acne vulgaris 12/29/2015   Health care maintenance 12/29/2015   Seasonal allergies 08/27/2015   MDD (major depressive disorder), single episode, severe , no psychosis ([place]) 04/12/2015   Suicide attempt ([place])     History reviewed. No pertinent surgical history.  OB History   No obstetric history on file.      Home Medications    Prior to Admission medications   Medication Sig Start Date End Date Taking? Authorizing Provider  oseltamivir (TAMIFLU) 75 MG capsule Take 1 capsule (75 mg total) by mouth every 12 (twelve) hours. 06/27/22  Yes Francene Finders, PA-C  SPRINTEC 28 0.25-35 MG-MCG tablet TAKE 1 TABLET BY MOUTH EVERY DAY 01/17/19   Jacelyn Pi, Lilia Argue, MD  terbinafine (LAMISIL) 1 % cream Apply 1 application topically 2 (two) times daily. Apply daily for 3-4 weeks, or until rash resolves. 11/11/17   McVey, Gelene Mink, PA-C    Family History Family History  Problem Relation Age of Onset   Hypertension Mother    Hypertension Maternal Grandmother    Heart disease Maternal Grandmother    Hypertension Maternal Grandfather     Social History Social History   Tobacco Use   Smoking status: Never   Smokeless tobacco: Never  Vaping Use   Vaping Use: Never used  Substance Use Topics   Alcohol use:  Not Currently   Drug use: No     Allergies   Patient has no known allergies.   Review of Systems Review of Systems  Constitutional:  Positive for chills and fever.  HENT:  Positive for congestion. Negative for ear pain and sore throat.   Eyes:  Negative for discharge and redness.  Respiratory:  Positive for cough. Negative for shortness of breath and wheezing.   Gastrointestinal:  Positive for nausea and vomiting. Negative for diarrhea.  Musculoskeletal:  Positive for myalgias.     Physical Exam Triage Vital Signs ED Triage Vitals  Enc Vitals Group     BP      Pulse      Resp      Temp      Temp src      SpO2      Weight      Height      Head Circumference      Peak Flow      Pain Score      Pain Loc      Pain Edu?      Excl. in [place]?    No data found.  Updated Vital Signs BP 113/76 (BP Location: Right Arm)   Pulse 98   Temp 100.3 F (37.9 C) (Oral)   Resp 20   LMP 06/23/2022 (Approximate)   SpO2  94%      Physical Exam Vitals and nursing note reviewed.  Constitutional:      General: She is not in acute distress.    Appearance: Normal appearance. She is not ill-appearing.  HENT:     Head: Normocephalic and atraumatic.     Nose: Congestion present.     Mouth/Throat:     Mouth: Mucous membranes are moist.     Pharynx: No oropharyngeal exudate or posterior oropharyngeal erythema.  Eyes:     Conjunctiva/sclera: Conjunctivae normal.  Cardiovascular:     Rate and Rhythm: Normal rate and regular rhythm.     Heart sounds: Normal heart sounds. No murmur heard. Pulmonary:     Effort: Pulmonary effort is normal. No respiratory distress.     Breath sounds: Normal breath sounds. No wheezing, rhonchi or rales.  Skin:    General: Skin is warm and dry.  Neurological:     Mental Status: She is alert.  Psychiatric:        Mood and Affect: Mood normal.        Thought Content: Thought content normal.      UC Treatments / Results  Labs (all labs ordered are  listed, but only abnormal results are displayed) Labs Reviewed  SARS CORONAVIRUS 2 (TAT 6-24 HRS)    EKG   Radiology No results found.  Procedures Procedures (including critical care time)  Medications Ordered in UC Medications - No data to display  Initial Impression / Assessment and Plan / UC Course  I have reviewed the triage vital signs and the nursing notes.  Pertinent labs & imaging results that were available during my care of the patient were reviewed by me and considered in my medical decision making (see chart for details).    Suspect viral etiology of symptoms.  Will treat to cover influenza with Tamiflu as patient is in treatment window and we do not have resources to test for same.  Will also screen for COVID.  Recommended continued symptomatic treatment, increase fluids and rest.  Encouraged follow-up if no gradual improvement or with any further concerns.  Final Clinical Impressions(s) / UC Diagnoses   Final diagnoses:  Acute upper respiratory infection  Encounter for screening for COVID-19  Flu-like symptoms   Discharge Instructions   None    ED Prescriptions     Medication Sig Dispense Auth. Provider   oseltamivir (TAMIFLU) 75 MG capsule Take 1 capsule (75 mg total) by mouth every 12 (twelve) hours. 10 capsule Francene Finders, PA-C      PDMP not reviewed this encounter.   Francene Finders, PA-C 06/27/22 [phone]

## 2022-06-27 NOTE — ED Triage Notes (Signed)
Pt c/o cough, fever, body aches stared 2 days ago-NAD-steady gait

## 2022-06-28 LAB — SARS CORONAVIRUS 2 (TAT 6-24 HRS): SARS Coronavirus 2: NEGATIVE
